# Patient Record
Sex: Male | Born: 1955 | Race: White | Hispanic: No | Marital: Married | Smoking: Never smoker
Health system: Southern US, Community
[De-identification: ages and names within clinical notes are randomized; demographics above are authoritative.]

## PROBLEM LIST (undated history)

## (undated) DIAGNOSIS — F419 Anxiety disorder, unspecified: Secondary | ICD-10-CM

## (undated) DIAGNOSIS — M109 Gout, unspecified: Secondary | ICD-10-CM

## (undated) DIAGNOSIS — E785 Hyperlipidemia, unspecified: Secondary | ICD-10-CM

## (undated) DIAGNOSIS — M1990 Unspecified osteoarthritis, unspecified site: Secondary | ICD-10-CM

## (undated) DIAGNOSIS — I10 Essential (primary) hypertension: Secondary | ICD-10-CM

## (undated) DIAGNOSIS — Z9109 Other allergy status, other than to drugs and biological substances: Secondary | ICD-10-CM

## (undated) DIAGNOSIS — K219 Gastro-esophageal reflux disease without esophagitis: Secondary | ICD-10-CM

## (undated) HISTORY — DX: Gout, unspecified: M10.9

## (undated) HISTORY — DX: Essential (primary) hypertension: I10

## (undated) HISTORY — PX: COLONOSCOPY: SHX174

## (undated) HISTORY — PX: HEEL SPUR SURGERY: SHX665

## (undated) HISTORY — DX: Hyperlipidemia, unspecified: E78.5

## (undated) HISTORY — DX: Anxiety disorder, unspecified: F41.9

---

## 1997-12-17 ENCOUNTER — Emergency Department (HOSPITAL_COMMUNITY): Admission: EM | Admit: 1997-12-17 | Discharge: 1997-12-17 | Payer: Self-pay | Admitting: Emergency Medicine

## 1998-01-04 ENCOUNTER — Ambulatory Visit (HOSPITAL_COMMUNITY): Admission: RE | Admit: 1998-01-04 | Discharge: 1998-01-04 | Payer: Self-pay | Admitting: Cardiology

## 1998-01-14 ENCOUNTER — Ambulatory Visit (HOSPITAL_COMMUNITY): Admission: RE | Admit: 1998-01-14 | Discharge: 1998-01-14 | Payer: Self-pay | Admitting: Cardiology

## 2000-04-29 ENCOUNTER — Emergency Department (HOSPITAL_COMMUNITY): Admission: EM | Admit: 2000-04-29 | Discharge: 2000-04-29 | Payer: Self-pay | Admitting: Emergency Medicine

## 2000-05-03 ENCOUNTER — Encounter: Admission: RE | Admit: 2000-05-03 | Discharge: 2000-05-03 | Payer: Self-pay

## 2005-07-19 ENCOUNTER — Encounter: Payer: Self-pay | Admitting: Cardiology

## 2007-11-27 ENCOUNTER — Encounter: Admission: RE | Admit: 2007-11-27 | Discharge: 2007-11-27 | Payer: Self-pay | Admitting: Cardiology

## 2010-12-08 ENCOUNTER — Other Ambulatory Visit: Payer: Self-pay | Admitting: Family Medicine

## 2010-12-08 DIAGNOSIS — E041 Nontoxic single thyroid nodule: Secondary | ICD-10-CM

## 2010-12-09 ENCOUNTER — Other Ambulatory Visit: Payer: Self-pay

## 2010-12-16 ENCOUNTER — Ambulatory Visit
Admission: RE | Admit: 2010-12-16 | Discharge: 2010-12-16 | Disposition: A | Discharge status: Home / Self Care | Payer: BC Managed Care – PPO | Source: Ambulatory Visit | Attending: Family Medicine | Admitting: Family Medicine

## 2010-12-16 DIAGNOSIS — E041 Nontoxic single thyroid nodule: Secondary | ICD-10-CM

## 2011-10-25 ENCOUNTER — Ambulatory Visit (INDEPENDENT_AMBULATORY_CARE_PROVIDER_SITE_OTHER): Payer: BC Managed Care – PPO | Admitting: Internal Medicine

## 2011-10-25 DIAGNOSIS — R0602 Shortness of breath: Secondary | ICD-10-CM

## 2011-10-25 LAB — PULMONARY FUNCTION TEST

## 2011-10-25 NOTE — Progress Notes (Signed)
PFT done today. 

## 2011-11-02 ENCOUNTER — Encounter: Payer: Self-pay | Admitting: Internal Medicine

## 2013-04-29 ENCOUNTER — Encounter: Payer: Self-pay | Admitting: Family Medicine

## 2013-04-29 DIAGNOSIS — M109 Gout, unspecified: Secondary | ICD-10-CM | POA: Insufficient documentation

## 2013-04-29 DIAGNOSIS — E785 Hyperlipidemia, unspecified: Secondary | ICD-10-CM | POA: Insufficient documentation

## 2013-04-29 DIAGNOSIS — I10 Essential (primary) hypertension: Secondary | ICD-10-CM | POA: Insufficient documentation

## 2013-10-17 ENCOUNTER — Telehealth: Payer: Self-pay | Admitting: Cardiology

## 2013-10-17 NOTE — Telephone Encounter (Signed)
Dicky DoeLinda Ballard is calling regarding the phone conversation from the other day.  [phone]

## 2013-10-17 NOTE — Telephone Encounter (Signed)
Message attached to wrong chart.

## 2013-11-23 ENCOUNTER — Encounter: Payer: Self-pay | Admitting: *Deleted

## 2019-09-17 ENCOUNTER — Encounter: Payer: Self-pay | Admitting: Family Medicine

## 2019-09-17 ENCOUNTER — Ambulatory Visit: Payer: Medicare PPO | Admitting: Family Medicine

## 2019-09-17 ENCOUNTER — Other Ambulatory Visit: Payer: Self-pay

## 2019-09-17 ENCOUNTER — Ambulatory Visit (INDEPENDENT_AMBULATORY_CARE_PROVIDER_SITE_OTHER): Payer: Medicare PPO

## 2019-09-17 VITALS — BP 105/71 | HR 71 | Temp 99.3°F

## 2019-09-17 DIAGNOSIS — I10 Essential (primary) hypertension: Secondary | ICD-10-CM

## 2019-09-17 DIAGNOSIS — R0789 Other chest pain: Secondary | ICD-10-CM

## 2019-09-17 DIAGNOSIS — Z77090 Contact with and (suspected) exposure to asbestos: Secondary | ICD-10-CM | POA: Diagnosis not present

## 2019-09-17 DIAGNOSIS — R062 Wheezing: Secondary | ICD-10-CM | POA: Diagnosis not present

## 2019-09-17 DIAGNOSIS — E119 Type 2 diabetes mellitus without complications: Secondary | ICD-10-CM | POA: Insufficient documentation

## 2019-09-17 MED ORDER — LORATADINE 10 MG PO TABS: 10.0000 mg | ORAL_TABLET | Freq: Every day | ORAL | 11 refills | Status: DC | PRN

## 2019-09-17 MED ORDER — ALBUTEROL SULFATE HFA 108 (90 BASE) MCG/ACT IN AERS
2.0000 | INHALATION_SPRAY | Freq: Four times a day (QID) | RESPIRATORY_TRACT | 6 refills | Status: DC | PRN
Start: 2019-09-17 — End: 2021-08-01

## 2019-09-17 MED ORDER — MONTELUKAST SODIUM 10 MG PO TABS: 10.0000 mg | ORAL_TABLET | Freq: Every day | ORAL | 11 refills | Status: DC

## 2019-09-17 NOTE — Progress Notes (Signed)
Office Visit Note   Patient: Theodore Ballard           Date of Birth: October 11, 1955           MRN: [id number] Visit Date: 09/17/2019 Requested by: No referring provider defined for this encounter. PCP: No primary care provider on file.  Subjective: Chief Complaint  Patient presents with  . SOB, weakness, slt ST, cough/wheezing    HPI: He is here to reestablish care.  His main reason for the visit is shortness of breath, wheezing and chest tightness.  Symptoms started about 2 or 3 weeks ago.  He thought he had a bad cold.  His symptoms were not improving so he went and got tested for Covid, it was negative.  He was given albuterol which has helped.  He continues to have shortness of breath and weakness.  Denies fevers or chills, his head congestion is almost resolved.  No gastrointestinal symptoms.  He has never had cardiac problems.  He does note that his previous PCP was getting EKGs on a regular basis for a while but eventually he felt that everything looked okay.  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  He has had lung cancer screening in the past and would like to have that done again.  He has hypertension and is now diabetic.  His diabetes had been well controlled with an A1c of around 6, but he has not been to his previous PCP in about 2 years.  He checks his blood sugar regularly at home.  He has a history of gout affecting multiple joints.  He has a lot of arthritis.               ROS:   All other systems were reviewed and are negative.  Objective: Vital Signs: BP 105/71   Pulse 71   Temp 99.3 F (37.4 C)   SpO2 96%   Physical Exam:  General:  Alert and oriented, in no acute distress. Pulm:  Breathing unlabored. Psy:  Normal mood, congruent affect.  HEENT:  [place]/AT, PERRLA, EOM Full, no nystagmus.  Funduscopic examination within normal limits.  No conjunctival erythema.  Tympanic membranes are pearly gray with normal  landmarks.  External ear canals are clear on the left, partially occluded by cerumen on the right.  Nasal passages are slightly congested.  Oropharynx is clear.  No significant lymphadenopathy.  No thyromegaly or nodules.  2+ carotid pulses without bruits. CV: Regular rate and rhythm without murmurs, rubs, or gallops.  No peripheral edema.  2+ radial and posterior tibial pulses. Lungs: Clear to auscultation throughout with no wheezing or areas of consolidation.    Imaging: XR Chest 2 View  Result Date: 09/17/2019 Chest x-ray shows no sign of pneumonia.  I do not see any nodules.  Heart size appears normal.   Assessment & Plan: 1.  Wheezing and chest tightness, possibly allergy mediated. -We will add Singulair and Claritin to his albuterol regimen. -We will draw cardiac labs and order EKG as well.  2.  History of asbestos exposure -CT scan to screen for mesothelioma.  3.  Diabetes and hypertension -Labs to monitor.     Procedures: No procedures performed  No notes on file     PMFS History: Patient Active Problem List   Diagnosis Date Noted  . Asbestos exposure 09/17/2019  . Diabetes (HCC) 09/17/2019  . Hypertension 04/29/2013  . Other and unspecified hyperlipidemia 04/29/2013  .  Gout 04/29/2013   Past Medical History:  Diagnosis Date  . Anxiety   . Gout   . Hyperlipidemia   . Hypertension     Family History  Problem Relation Age of Onset  . Cancer Mother   . Heart disease Father   . Hypertension Father   . Cancer Sister     History reviewed. No pertinent surgical history. Social History   Occupational History  . Not on file  Tobacco Use  . Smoking status: Never Smoker  . Smokeless tobacco: Never Used  Substance and Sexual Activity  . Alcohol use: No  . Drug use: No  . Sexual activity: Yes

## 2019-09-18 ENCOUNTER — Telehealth: Payer: Self-pay | Admitting: Family Medicine

## 2019-09-18 DIAGNOSIS — R7989 Other specified abnormal findings of blood chemistry: Secondary | ICD-10-CM

## 2019-09-18 DIAGNOSIS — E781 Pure hyperglyceridemia: Secondary | ICD-10-CM

## 2019-09-18 LAB — CBC WITH DIFFERENTIAL/PLATELET
Absolute Monocytes: 943 cells/uL (ref 200–950)
Basophils Absolute: 72 cells/uL (ref 0–200)
Basophils Relative: 1 %
Eosinophils Absolute: 130 cells/uL (ref 15–500)
Eosinophils Relative: 1.8 %
HCT: 43.7 % (ref 38.5–50.0)
Hemoglobin: 15.4 g/dL (ref 13.2–17.1)
Lymphs Abs: 1980 cells/uL (ref 850–3900)
MCH: 32.7 pg (ref 27.0–33.0)
MCHC: 35.2 g/dL (ref 32.0–36.0)
MCV: 92.8 fL (ref 80.0–100.0)
MPV: 11.2 fL (ref 7.5–12.5)
Monocytes Relative: 13.1 %
Neutro Abs: 4075 cells/uL (ref 1500–7800)
Neutrophils Relative %: 56.6 %
Platelets: 204 10*3/uL (ref 140–400)
RBC: 4.71 10*6/uL (ref 4.20–5.80)
RDW: 13.5 % (ref 11.0–15.0)
Total Lymphocyte: 27.5 %
WBC: 7.2 10*3/uL (ref 3.8–10.8)

## 2019-09-18 LAB — HEMOGLOBIN A1C
Hgb A1c MFr Bld: 6.2 % of total Hgb — ABNORMAL HIGH (ref <5.7)
Mean Plasma Glucose: 131 (calc)
eAG (mmol/L): 7.3 (calc)

## 2019-09-18 LAB — SEDIMENTATION RATE: Sed Rate: 22 mm/h — ABNORMAL HIGH (ref 0–20)

## 2019-09-18 LAB — COMPREHENSIVE METABOLIC PANEL
AG Ratio: 1.7 (calc) (ref 1.0–2.5)
ALT: 35 U/L (ref 9–46)
AST: 27 U/L (ref 10–35)
Albumin: 4.5 g/dL (ref 3.6–5.1)
Alkaline phosphatase (APISO): 77 U/L (ref 35–144)
BUN/Creatinine Ratio: 16 (calc) (ref 6–22)
BUN: 21 mg/dL (ref 7–25)
CO2: 27 mmol/L (ref 20–32)
Calcium: 10.1 mg/dL (ref 8.6–10.3)
Chloride: 103 mmol/L (ref 98–110)
Creat: 1.28 mg/dL — ABNORMAL HIGH (ref 0.70–1.25)
Globulin: 2.7 g/dL (calc) (ref 1.9–3.7)
Glucose, Bld: 80 mg/dL (ref 65–139)
Potassium: 4.4 mmol/L (ref 3.5–5.3)
Sodium: 141 mmol/L (ref 135–146)
Total Bilirubin: 0.6 mg/dL (ref 0.2–1.2)
Total Protein: 7.2 g/dL (ref 6.1–8.1)

## 2019-09-18 LAB — LIPID PANEL
Cholesterol: 180 mg/dL (ref <200)
HDL: 29 mg/dL — ABNORMAL LOW (ref >=40)
Non-HDL Cholesterol (Calc): 151 mg/dL (calc) — ABNORMAL HIGH (ref <130)
Total CHOL/HDL Ratio: 6.2 (calc) — ABNORMAL HIGH (ref <5.0)
Triglycerides: 693 mg/dL — ABNORMAL HIGH (ref <150)

## 2019-09-18 LAB — CK TOTAL AND CKMB (NOT AT ARMC)
CK, MB: 0.9 ng/mL (ref 0–5.0)
Relative Index: 1 (ref 0–4.0)
Total CK: 94 U/L (ref 44–196)

## 2019-09-18 LAB — C-REACTIVE PROTEIN: CRP: 8.9 mg/L — ABNORMAL HIGH (ref <8.0)

## 2019-09-18 LAB — TROPONIN I: Troponin I: 4 ng/L (ref <=47)

## 2019-09-18 NOTE — Telephone Encounter (Signed)
Labs are notable for the following:  Heart enzymes were normal.  C-reactive protein, inflammation marker, is slightly elevated at 8.9.  Diabetes looks okay with hemoglobin A1c of 6.2.  Kidney function/creatinine is borderline abnormal at 1.28.  We should recheck this.  Theodore Ballard is borderline 2 to 3 months.  Lipid panel is abnormal with a triglyceride level of 693.  This can potentially cause pancreatitis.  It is very important to eat healthfully and minimize intake of sugary foods and fatty foods.  We should recheck triglycerides in 1 to 2 weeks to be sure they are coming down.

## 2019-09-19 ENCOUNTER — Ambulatory Visit: Payer: Medicare PPO

## 2019-09-19 ENCOUNTER — Telehealth: Payer: Self-pay | Admitting: *Deleted

## 2019-09-19 NOTE — Telephone Encounter (Signed)
I called and advised the patient of his results and diet instructions. He will come in on 09/29/19 to have his fasting lipids rechecked.

## 2019-09-19 NOTE — Telephone Encounter (Signed)
Received call and email from [facility] with [facility] stating order needs to be changed to CT CHEST HIGH RESOLUTION IMG 1263 due to dx. Order has been changed and email has been sent to [facility] advising her of the changed.

## 2019-09-23 ENCOUNTER — Encounter: Payer: Self-pay | Admitting: Family Medicine

## 2019-09-23 ENCOUNTER — Ambulatory Visit: Payer: Medicare PPO | Admitting: Family Medicine

## 2019-09-23 ENCOUNTER — Other Ambulatory Visit: Payer: Self-pay

## 2019-09-23 DIAGNOSIS — R300 Dysuria: Secondary | ICD-10-CM

## 2019-09-23 MED ORDER — SULFAMETHOXAZOLE-TRIMETHOPRIM 800-160 MG PO TABS: 1.0000 | ORAL_TABLET | Freq: Two times a day (BID) | ORAL | 0 refills | Status: DC

## 2019-09-23 NOTE — Progress Notes (Signed)
   Office Visit Note   Patient: Theodore Ballard           Date of Birth: Jul 27, 1955           MRN: [id number] Visit Date: 09/23/2019 Requested by: Lavada Mesi, MD [address] [place],  [place] [address] PCP: Lavada Mesi, MD  Subjective: Chief Complaint  Patient presents with  . dysuria x 6 days, worse in morning    HPI: He is here with burning on urination.  Symptoms started about 6 days ago.  It is worse in the morning, better as the day progresses.  No definite fever or chills.              ROS:   All other systems were reviewed and are negative.  Objective: Vital Signs: There were no vitals taken for this visit.  Physical Exam:  General:  Alert and oriented, in no acute distress. Pulm:  Breathing unlabored. Psy:  Normal mood, congruent affect. Skin: No rash Back: No significant CVA tenderness.  Imaging: No results found.  Assessment & Plan: 1.  Dysuria, possible UTI -We will do urine studies, treat with Bactrim.  Will notify me if symptoms worsen.     Procedures: No procedures performed  No notes on file     PMFS History: Patient Active Problem List   Diagnosis Date Noted  . Asbestos exposure 09/17/2019  . Diabetes (HCC) 09/17/2019  . Hypertension 04/29/2013  . Other and unspecified hyperlipidemia 04/29/2013  . Gout 04/29/2013   Past Medical History:  Diagnosis Date  . Anxiety   . Gout   . Hyperlipidemia   . Hypertension     Family History  Problem Relation Age of Onset  . Cancer Mother   . Heart disease Father   . Hypertension Father   . Cancer Sister     History reviewed. No pertinent surgical history. Social History   Occupational History  . Not on file  Tobacco Use  . Smoking status: Never Smoker  . Smokeless tobacco: Never Used  Substance and Sexual Activity  . Alcohol use: No  . Drug use: No  . Sexual activity: Yes

## 2019-09-24 ENCOUNTER — Telehealth: Payer: Self-pay | Admitting: Family Medicine

## 2019-09-24 LAB — URINALYSIS W MICROSCOPIC + REFLEX CULTURE
Bacteria, UA: NONE SEEN /HPF
Bilirubin Urine: NEGATIVE
Glucose, UA: NEGATIVE
Hgb urine dipstick: NEGATIVE
Hyaline Cast: NONE SEEN /LPF
Ketones, ur: NEGATIVE
Leukocyte Esterase: NEGATIVE
Nitrites, Initial: NEGATIVE
Protein, ur: NEGATIVE
RBC / HPF: NONE SEEN /HPF (ref 0–2)
Specific Gravity, Urine: 1.019 (ref 1.001–1.03)
Squamous Epithelial / HPF: NONE SEEN /HPF (ref <=5)
WBC, UA: NONE SEEN /HPF (ref 0–5)
pH: <=5 (ref 5.0–8.0)

## 2019-09-24 LAB — NO CULTURE INDICATED

## 2019-09-24 MED ORDER — CIPROFLOXACIN HCL 500 MG PO TABS: 500.0000 mg | ORAL_TABLET | Freq: Two times a day (BID) | ORAL | 0 refills | Status: DC

## 2019-09-24 NOTE — Telephone Encounter (Signed)
Urine looks normal

## 2019-09-24 NOTE — Telephone Encounter (Signed)
Cipro Rx sent instead.

## 2019-09-24 NOTE — Telephone Encounter (Signed)
Patient called. Says he started taking the bactrim yesterday and now has bumps under his arm and is red. Would like Terri to call him. His number is [phone]

## 2019-09-24 NOTE — Telephone Encounter (Signed)
I called the patient. The rash is getting better, since he first called. He does have Benedryl to take, if needed. Added Bactrim to his allergy list. Advised of the new antibiotic sent in. He is to call us with any problems.

## 2019-09-24 NOTE — Telephone Encounter (Signed)
Change antibiotic?

## 2019-09-29 ENCOUNTER — Other Ambulatory Visit: Payer: Self-pay

## 2019-09-29 ENCOUNTER — Other Ambulatory Visit: Payer: Self-pay | Admitting: Family Medicine

## 2019-09-29 DIAGNOSIS — R300 Dysuria: Secondary | ICD-10-CM

## 2019-09-29 DIAGNOSIS — R7989 Other specified abnormal findings of blood chemistry: Secondary | ICD-10-CM

## 2019-09-29 DIAGNOSIS — E781 Pure hyperglyceridemia: Secondary | ICD-10-CM

## 2019-09-29 MED ORDER — GLIPIZIDE ER 2.5 MG PO TB24: 2.5000 mg | ORAL_TABLET | Freq: Two times a day (BID) | ORAL | 1 refills | Status: DC

## 2019-09-30 ENCOUNTER — Telehealth: Payer: Self-pay | Admitting: Family Medicine

## 2019-09-30 DIAGNOSIS — R972 Elevated prostate specific antigen [PSA]: Secondary | ICD-10-CM

## 2019-09-30 DIAGNOSIS — E781 Pure hyperglyceridemia: Secondary | ICD-10-CM

## 2019-09-30 LAB — BASIC METABOLIC PANEL
BUN: 22 mg/dL (ref 7–25)
CO2: 25 mmol/L (ref 20–32)
Calcium: 9.5 mg/dL (ref 8.6–10.3)
Chloride: 99 mmol/L (ref 98–110)
Creat: 0.87 mg/dL (ref 0.70–1.25)
Glucose, Bld: 124 mg/dL — ABNORMAL HIGH (ref 65–99)
Potassium: 5.2 mmol/L (ref 3.5–5.3)
Sodium: 136 mmol/L (ref 135–146)

## 2019-09-30 LAB — TRIGLYCERIDES: Triglycerides: 769 mg/dL — ABNORMAL HIGH (ref <150)

## 2019-09-30 LAB — PSA: PSA: 4.6 ng/mL — ABNORMAL HIGH (ref <=4.0)

## 2019-09-30 MED ORDER — FENOFIBRATE 145 MG PO TABS: 145.0000 mg | ORAL_TABLET | Freq: Every day | ORAL | 3 refills | Status: DC

## 2019-09-30 NOTE — Telephone Encounter (Signed)
Labs show:  Triglycerides are even higher at 769.  This puts you at higher risk for pancreatitis.  I will call in fenofibrate to bring the levels down.  Recheck in about 4-6 weeks.  Prostate PSA is elevated at 4.6.  This could be due to prostatitis (inflammation or infection of the prostate).  But after we finish treatment, we should recheck PSA in about 6-12 weeks to be sure it's normalizing.  Kidney function (creatinine) is back in normal range.

## 2019-09-30 NOTE — Telephone Encounter (Signed)
Patient read his results in MyChart. Scheduled recheck of PSA & triglycerides for 11/14/19.

## 2019-10-01 ENCOUNTER — Ambulatory Visit
Admission: RE | Admit: 2019-10-01 | Discharge: 2019-10-01 | Disposition: A | Discharge status: Home / Self Care | Payer: Medicare PPO | Source: Ambulatory Visit | Attending: Family Medicine | Admitting: Family Medicine

## 2019-10-01 DIAGNOSIS — Z77090 Contact with and (suspected) exposure to asbestos: Secondary | ICD-10-CM

## 2019-10-02 ENCOUNTER — Telehealth: Payer: Self-pay | Admitting: Family Medicine

## 2019-10-02 DIAGNOSIS — R0789 Other chest pain: Secondary | ICD-10-CM

## 2019-10-02 DIAGNOSIS — I7090 Unspecified atherosclerosis: Secondary | ICD-10-CM

## 2019-10-02 NOTE — Telephone Encounter (Signed)
CT scan shows no sign of cancer, but there is calcification (and therefore plaque build-up) in the heart arteries and also in the aortic valve of the heart.   I recommend referral to a cardiologist to do additional testing.

## 2019-10-02 NOTE — Addendum Note (Signed)
Addended by: Lillia Carmel on: 10/02/2019 09:02 AM   Modules accepted: Orders

## 2019-11-14 ENCOUNTER — Ambulatory Visit (INDEPENDENT_AMBULATORY_CARE_PROVIDER_SITE_OTHER): Payer: Medicare PPO

## 2019-11-14 ENCOUNTER — Other Ambulatory Visit: Payer: Self-pay

## 2019-11-14 DIAGNOSIS — R972 Elevated prostate specific antigen [PSA]: Secondary | ICD-10-CM

## 2019-11-14 DIAGNOSIS — E781 Pure hyperglyceridemia: Secondary | ICD-10-CM | POA: Diagnosis not present

## 2019-11-14 NOTE — Progress Notes (Signed)
CMET, CBC/diff and triglycerides drawn (fasting) per Dr. Prince Rome.

## 2019-11-16 NOTE — Progress Notes (Signed)
Cardiology Office Note:    Date:  11/21/2019   ID:  Theodore, Ballard March 27, 1955, MRN [id number]  PCP:  Theodore Mesi, MD  Cardiologist:  No primary care provider on file.   Referring MD: Theodore Mesi, MD   Chief Complaint  Patient presents with  . Coronary Artery Disease    History of Present Illness:    Theodore Ballard is a 64 y.o. male with a hx of DM II, Hypertension, Hyperlipidemia, and coronary calcification on non coronary high resolution chest CT. Also h/o asbestos exposure.  Mrs. Theodore Ballard reminds me that I saw him 15 years ago for palpitations.  We did not identify a particular rhythm disturbance at that time.  He also tells me that I was suspicious he had sleep apnea but this was not corroborated by testing.  He still has loud snoring to the point that he and his wife no longer sleep in the same bed.  States he is sluggish many mornings when he awakens because he feels he has not slept well.  Because of prior asbestos exposure, a high resolution chest CT was performed by Dr. Rennis Ballard to exclude the possibility of cancer.  An ancillary finding was the presence of three-vessel coronary calcification.  This is led to the cardiac evaluation for asymptomatic coronary atherosclerosis.  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  He walks great distances without shortness of breath or discomfort in the chest.  He is here for preventive measures if possible.  He has significant hypertriglyceridemia that in the past has been treated with fibrate's but these were stopped because of lower extremity discomfort.  Risk factors for coronary disease include type 2 diabetes, essential hypertension, difficulty with sleep, and snoring.  His father died at 33.  Lifelong history of hypertension.  His grandfather died of CVA.  He has siblings without history of vascular disease.  Past Medical History:  Diagnosis Date  . Anxiety   . Gout   .  Hyperlipidemia   . Hypertension     History reviewed. No pertinent surgical history.  Current Medications: Current Meds  Medication Sig  . albuterol (VENTOLIN HFA) 108 (90 Base) MCG/ACT inhaler Inhale 2 puffs into the lungs every 6 (six) hours as needed for wheezing or shortness of breath.  . allopurinol (ZYLOPRIM) 300 MG tablet   . aspirin 81 MG tablet Take 81 mg by mouth daily.  Marland Kitchen atenolol (TENORMIN) 50 MG tablet Take 50 mg by mouth 2 (two) times daily.   Marland Kitchen glipiZIDE (GLUCOTROL XL) 2.5 MG 24 hr tablet Take 1 tablet (2.5 mg total) by mouth in the morning and at bedtime.  Marland Kitchen lisinopril (PRINIVIL,ZESTRIL) 10 MG tablet Take 10 mg by mouth daily.  . pantoprazole (PROTONIX) 40 MG tablet   . pravastatin (PRAVACHOL) 40 MG tablet Take 1.5 tablets (60 mg total) by mouth daily.     Allergies:   Bactrim [sulfamethoxazole-trimethoprim] and Penicillins   Social History   Socioeconomic History  . Marital status: Married    Spouse name: Not on file  . Number of children: Not on file  . Years of education: Not on file  . Highest education level: Not on file  Occupational History  . Not on file  Tobacco Use  . Smoking status: Never Smoker  . Smokeless tobacco: Never Used  Substance and Sexual Activity  . Alcohol use: No  . Drug use: No  .  Sexual activity: Yes  Other Topics Concern  . Not on file  Social History Narrative  . Not on file   Social Determinants of Health   Financial Resource Strain:   . Difficulty of Paying Living Expenses: Not on file  Food Insecurity:   . Worried About Programme researcher, broadcasting/film/video in the Last Year: Not on file  . Ran Out of Food in the Last Year: Not on file  Transportation Needs:   . Lack of Transportation (Medical): Not on file  . Lack of Transportation (Non-Medical): Not on file  Physical Activity:   . Days of Exercise per Week: Not on file  . Minutes of Exercise per Session: Not on file  Stress:   . Feeling of Stress : Not on file  Social  Connections:   . Frequency of Communication with Friends and Family: Not on file  . Frequency of Social Gatherings with Friends and Family: Not on file  . Attends Religious Services: Not on file  . Active Member of Clubs or Organizations: Not on file  . Attends Banker Meetings: Not on file  . Marital Status: Not on file     Family History: The patient's family history includes Cancer in his mother and sister; Heart disease in his father; Hypertension in his father.  ROS:   Please see the history of present illness.    No new data.  He is not short of breath.  He does not cough.  Palpitations are no longer an issue.  He has tried fish oil in the past and had gout because of this.  All other systems reviewed and are negative.  EKGs/Labs/Other Studies Reviewed:    The following studies were reviewed today: No new imaging.  High resolution CT scan July 2021:  IMPRESSION: 1. No evidence to suggest asbestos related pleural disease or asbestosis. 2. 3 mm right lower lobe pulmonary nodule, nonspecific, but statistically likely benign. No follow-up needed if patient is low-risk. Non-contrast chest CT can be considered in 12 months if patient is high-risk. This recommendation follows the consensus statement: Guidelines for Management of Incidental Pulmonary Nodules Detected on CT Images: From the Fleischner Society 2017; Radiology 2017; 284:228-243. 3. Aortic atherosclerosis, in addition to left main and 2 vessel coronary artery disease. Please note that although the presence of coronary artery calcium documents the presence of coronary artery disease, the severity of this disease and any potential stenosis cannot be assessed on this non-gated CT examination. Assessment for potential risk factor modification, dietary therapy or pharmacologic therapy may be warranted, if clinically indicated. 4. There are calcifications of the aortic valve. Echocardiographic correlation  for evaluation of potential valvular dysfunction may be warranted if clinically indicated.  Aortic Atherosclerosis (ICD10-I70.0).   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  When compared to the prior tracing from July 2015  Recent Labs: 09/17/2019: Hemoglobin 15.4; Platelets 204 11/14/2019: ALT 15; BUN 17; Creat 0.83; Potassium 4.3; Sodium 138  Recent Lipid Panel    Component Value Date/Time   CHOL 180 09/17/2019 1304   TRIG 762 (H) 11/14/2019 0915   HDL 29 (L) 09/17/2019 1304   CHOLHDL 6.2 (H) 09/17/2019 1304   LDLCALC  09/17/2019 1304     Comment:     . LDL cholesterol not calculated. Triglyceride levels greater than 400 mg/dL invalidate calculated LDL results. . Reference range: <100 . Desirable range <100 mg/dL for primary prevention;   <70 mg/dL  for patients with CHD or diabetic patients  with > or = 2 CHD risk factors. Marland Kitchen LDL-C is now calculated using the Martin-Hopkins  calculation, which is a validated novel method providing  better accuracy than the Friedewald equation in the  estimation of LDL-C.  Theodore Ballard et al. Lenox Ahr. 0814;481(85): 2061-2068  (http://education.[employer].com/faq/FAQ164)     Physical Exam:    VS:  BP (!) 134/92   Pulse (!) 50   Ht 5' 11.5" (1.816 m)   Wt 274 lb 12.8 oz (124.6 kg)   SpO2 98%   BMI 37.79 kg/m     Wt Readings from Last 3 Encounters:  11/21/19 274 lb 12.8 oz (124.6 kg)     GEN: Obese. No acute distress HEENT: Normal NECK: No JVD. LYMPHATICS: No lymphadenopathy CARDIAC:  RRR without murmur, gallop, or edema. VASCULAR:  Normal Pulses. No bruits. RESPIRATORY:  Clear to auscultation without rales, wheezing or rhonchi  ABDOMEN: Soft, non-tender, non-distended, No pulsatile mass, MUSCULOSKELETAL: No deformity  SKIN: Warm and dry NEUROLOGIC:  Alert and oriented x 3 PSYCHIATRIC:  Normal affect   ASSESSMENT:    1. Coronary artery  calcification seen on CAT scan   2. Type 2 diabetes mellitus without complication, without long-term current use of insulin (HCC)   3. Essential hypertension   4. Hyperlipidemia LDL goal <70   5. Hypertriglyceridemia   6. Morbid obesity (HCC)   7. Educated about COVID-19 virus infection   8. Asbestos exposure    PLAN:    In order of problems listed above:  Overall education and awareness concerning primary risk prevention was discussed in detail: LDL less than 70, hemoglobin A1c less than 7, blood pressure target less than 130/80 mmHg, >150 minutes of moderate aerobic activity per week, avoidance of smoking, weight control (via diet and exercise), and continued surveillance/management of/for obstructive sleep apnea. The most recent hemoglobin A1c was 6.2.  Current therapy is Glucotrol and diet.  Consider SGLT2 at some point in the future if necessary.   Blood pressure today is borderline.  Target is 130/80.  Diastolic today is 92.  Low-salt diet, weight loss, and avoidance of nonsteroidals is suggested to the patient.  Continue Zestril 10 mg/day.  This could be increased to 20.  Continue Tenormin 50 mg/day.  Could consider adding low-dose diuretic although he is troubled by gout. LDL cholesterol when checked earlier this year could not be assessed because of hypertriglyceridemia.  The non-HDL cholesterol was 151.  This is 50 points higher than it should be and we should therefore consider increasing intensity of statin therapy.  Continue pravastatin at 60 mg/day.  Consider switching to rosuvastatin. He has tried fibrate's in the past.  These medications cause leg aching and pain.  We will try Vascepa 1000 mg twice daily for 1 month and then increase to 2 g twice daily.  Hopefully this will not precipitate gout.  Protective benefit from the cardiac standpoint can be seen in the absence of significant reduction and triglyceride levels. Needs to do something about this problem.  Caloric intake should  decrease, carbohydrate intake should decrease, sugar containing soft drinks should also be avoided. He has been vaccinated and is practicing medication techniques. No comment   Medication Adjustments/Labs and Tests Ordered: Current medicines are reviewed at length with the patient today.  Concerns regarding medicines are outlined above.  Orders Placed This Encounter  Procedures  . Lipid panel  . EKG 12-Lead   Meds ordered this encounter  Medications  . icosapent  Ethyl (VASCEPA) 1 g capsule    Sig: Take 2 capsules (2 g total) by mouth 2 (two) times daily.    Dispense:  120 capsule    Refill:  11    Patient Instructions  Medication Instructions:  1) START Vascepa 1 gram twice daily for a week and then increase to 2 grams twice daily.  *If you need a refill on your cardiac medications before your next appointment, please call your pharmacy*   Lab Work: Lipid panel in 2 months.  You will need to be fasting for this lab (nothing to eat or drink after midnight except water and black coffee).  If you have labs (blood work) drawn today and your tests are completely normal, you will receive your results only by: Marland Kitchen. MyChart Message (if you have MyChart) OR . A paper copy in the mail If you have any lab test that is abnormal or we need to change your treatment, we will call you to review the results.   Testing/Procedures: None   Follow-Up: At [facility], you and your health needs are our priority.  As part of our continuing mission to provide you with exceptional heart care, we have created designated Provider Care Teams.  These Care Teams include your primary Cardiologist (physician) and Advanced Practice Providers (APPs -  Physician Assistants and Nurse Practitioners) who all work together to provide you with the care you need, when you need it.  We recommend signing up for the patient portal called "MyChart".  Sign up information is provided on this After Visit Summary.  MyChart  is used to connect with patients for Virtual Visits (Telemedicine).  Patients are able to view lab/test results, encounter notes, upcoming appointments, etc.  Non-urgent messages can be sent to your provider as well.   To learn more about what you can do with MyChart, go to [email or web address].    Your next appointment:   12 month(s)  The format for your next appointment:   In Person  Provider:   You may see Dr. Verdis PrimeHenry Atom Solivan or one of the following Advanced Practice Providers on your designated Care Team:    Norma FredricksonLori Gerhardt, NP  Nada BoozerLaura Ingold, NP  Georgie ChardJill McDaniel, NP    Other Instructions      Signed, Lesleigh NoeHenry W Jenelle Drennon III, MD  11/21/2019 4:18 PM    [facility]

## 2019-11-17 ENCOUNTER — Telehealth: Payer: Self-pay | Admitting: Family Medicine

## 2019-11-17 LAB — PSA, TOTAL AND FREE
PSA, % Free: 35 % (calc) (ref >25)
PSA, Free: 0.9 ng/mL
PSA, Total: 2.6 ng/mL (ref <=4.0)

## 2019-11-17 LAB — COMPREHENSIVE METABOLIC PANEL
AG Ratio: 1.7 (calc) (ref 1.0–2.5)
ALT: 15 U/L (ref 9–46)
AST: 17 U/L (ref 10–35)
Albumin: 4.1 g/dL (ref 3.6–5.1)
Alkaline phosphatase (APISO): 57 U/L (ref 35–144)
BUN: 17 mg/dL (ref 7–25)
CO2: 27 mmol/L (ref 20–32)
Calcium: 8.9 mg/dL (ref 8.6–10.3)
Chloride: 102 mmol/L (ref 98–110)
Creat: 0.83 mg/dL (ref 0.70–1.25)
Globulin: 2.4 g/dL (calc) (ref 1.9–3.7)
Glucose, Bld: 119 mg/dL — ABNORMAL HIGH (ref 65–99)
Potassium: 4.3 mmol/L (ref 3.5–5.3)
Sodium: 138 mmol/L (ref 135–146)
Total Bilirubin: 0.7 mg/dL (ref 0.2–1.2)
Total Protein: 6.5 g/dL (ref 6.1–8.1)

## 2019-11-17 LAB — TRIGLYCERIDES: Triglycerides: 762 mg/dL — ABNORMAL HIGH (ref <150)

## 2019-11-17 MED ORDER — PRAVASTATIN SODIUM 40 MG PO TABS: 60.0000 mg | ORAL_TABLET | Freq: Every day | ORAL | 6 refills | Status: DC

## 2019-11-17 NOTE — Addendum Note (Signed)
Addended by: Lillia Carmel on: 11/17/2019 05:33 PM   Modules accepted: Orders

## 2019-11-17 NOTE — Telephone Encounter (Signed)
Labs show:  PSA looks much better at 2.6.  Would suggest rechecking in 3 to 4 months to be sure it is stable.  Glucose is abnormal but better at 119.  Triglycerides are still elevated at 762.  Are you still on fenofibrate as well as pravastatin?

## 2019-11-21 ENCOUNTER — Ambulatory Visit: Payer: Medicare PPO | Admitting: Interventional Cardiology

## 2019-11-21 ENCOUNTER — Encounter: Payer: Self-pay | Admitting: Interventional Cardiology

## 2019-11-21 ENCOUNTER — Other Ambulatory Visit: Payer: Self-pay

## 2019-11-21 VITALS — BP 134/92 | HR 50 | Ht 71.5 in | Wt 274.8 lb

## 2019-11-21 DIAGNOSIS — Z7189 Other specified counseling: Secondary | ICD-10-CM

## 2019-11-21 DIAGNOSIS — I2510 Atherosclerotic heart disease of native coronary artery without angina pectoris: Secondary | ICD-10-CM

## 2019-11-21 DIAGNOSIS — Z77090 Contact with and (suspected) exposure to asbestos: Secondary | ICD-10-CM

## 2019-11-21 DIAGNOSIS — E781 Pure hyperglyceridemia: Secondary | ICD-10-CM | POA: Diagnosis not present

## 2019-11-21 DIAGNOSIS — E785 Hyperlipidemia, unspecified: Secondary | ICD-10-CM | POA: Diagnosis not present

## 2019-11-21 DIAGNOSIS — I10 Essential (primary) hypertension: Secondary | ICD-10-CM

## 2019-11-21 DIAGNOSIS — E119 Type 2 diabetes mellitus without complications: Secondary | ICD-10-CM

## 2019-11-21 MED ORDER — ICOSAPENT ETHYL 1 G PO CAPS: 2.0000 g | ORAL_CAPSULE | Freq: Two times a day (BID) | ORAL | 11 refills | Status: DC

## 2019-11-21 NOTE — Patient Instructions (Signed)
Medication Instructions:  1) START Vascepa 1 gram twice daily for a week and then increase to 2 grams twice daily.  *If you need a refill on your cardiac medications before your next appointment, please call your pharmacy*   Lab Work: Lipid panel in 2 months.  You will need to be fasting for this lab (nothing to eat or drink after midnight except water and black coffee).  If you have labs (blood work) drawn today and your tests are completely normal, you will receive your results only by: Marland Kitchen MyChart Message (if you have MyChart) OR . A paper copy in the mail If you have any lab test that is abnormal or we need to change your treatment, we will call you to review the results.   Testing/Procedures: None   Follow-Up: At [facility], you and your health needs are our priority.  As part of our continuing mission to provide you with exceptional heart care, we have created designated Provider Care Teams.  These Care Teams include your primary Cardiologist (physician) and Advanced Practice Providers (APPs -  Physician Assistants and Nurse Practitioners) who all work together to provide you with the care you need, when you need it.  We recommend signing up for the patient portal called "MyChart".  Sign up information is provided on this After Visit Summary.  MyChart is used to connect with patients for Virtual Visits (Telemedicine).  Patients are able to view lab/test results, encounter notes, upcoming appointments, etc.  Non-urgent messages can be sent to your provider as well.   To learn more about what you can do with MyChart, go to [email or web address].    Your next appointment:   12 month(s)  The format for your next appointment:   In Person  Provider:   You may see Dr. Verdis Prime or one of the following Advanced Practice Providers on your designated Care Team:    Norma Fredrickson, NP  Nada Boozer, NP  Georgie Chard, NP    Other Instructions

## 2019-12-12 ENCOUNTER — Telehealth: Payer: Self-pay | Admitting: Family Medicine

## 2019-12-12 MED ORDER — OLOPATADINE HCL 0.1 % OP SOLN: 1.0000 [drp] | Freq: Two times a day (BID) | OPHTHALMIC | 12 refills | Status: AC

## 2019-12-12 NOTE — Telephone Encounter (Signed)
Rx sent.  Would recheck labs in 2-3 months.

## 2019-12-12 NOTE — Telephone Encounter (Signed)
Patient's wife called.   Another doctor of his gave him some medication that has caused his gout to worsen. The wife is requesting a call back to discuss what their next course of action should be.   Call back: [phone]

## 2019-12-12 NOTE — Addendum Note (Signed)
Addended by: Lillia Carmel on: 12/12/2019 01:51 PM   Modules accepted: Orders

## 2019-12-12 NOTE — Telephone Encounter (Signed)
I called and advised Theodore Ballard. Appointment made for repeat blood work 03/05/20 at 8:30.

## 2019-12-12 NOTE — Telephone Encounter (Signed)
I called the patient: Dr. Katrinka Blazing had put the patient on Vascepa, but the omega-3s in it caused his gout to flare-up. He stopped it per Dr. Michaelle Copas nurse. Theodore Ballard (the wife) wanted [place] to be aware of this. 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

## 2019-12-26 ENCOUNTER — Telehealth: Payer: Self-pay

## 2019-12-26 MED ORDER — DIPHENOXYLATE-ATROPINE 2.5-0.025 MG PO TABS: 1.0000 | ORAL_TABLET | Freq: Four times a day (QID) | ORAL | 0 refills | Status: AC | PRN

## 2019-12-26 NOTE — Telephone Encounter (Signed)
patient has had diarrhea since Tuesday , has tried imodium , dont know what to do , see if dr hilts  Can call him in anything that will help , or does patient need to come in at the earliest convenience.Marland KitchenMarland Kitchen

## 2019-12-26 NOTE — Telephone Encounter (Signed)
Please advise 

## 2019-12-26 NOTE — Telephone Encounter (Signed)
Lomotil Rx sent.  Would probably be a good idea to go to pharmacy and get tested for covid.

## 2019-12-26 NOTE — Telephone Encounter (Signed)
I left this information on Glenna's voice mail. Advised that he needs to try and replenish the fluids. Call back with any concerns/questions.

## 2019-12-26 NOTE — Telephone Encounter (Signed)
See other message on this from today ----- medication was sent in.

## 2019-12-26 NOTE — Telephone Encounter (Signed)
Per last message    Patient  Wife asked  to see if he can be worked in for today .Marland Kitchen

## 2020-01-23 ENCOUNTER — Other Ambulatory Visit: Payer: Medicare PPO

## 2020-02-09 ENCOUNTER — Telehealth: Payer: Self-pay | Admitting: Radiology

## 2020-02-09 MED ORDER — PANTOPRAZOLE SODIUM 40 MG PO TBEC: 40.0000 mg | DELAYED_RELEASE_TABLET | Freq: Every day | ORAL | 1 refills | Status: DC | PRN

## 2020-02-09 NOTE — Telephone Encounter (Signed)
I called and advised Theodore Ballard the Rx was sent in today.

## 2020-02-09 NOTE — Telephone Encounter (Signed)
I do not see that the pharmacy ever sent a request for pantoprazole. Please advise.

## 2020-02-09 NOTE — Telephone Encounter (Signed)
Rx sent.  I had not received a request until now.

## 2020-02-09 NOTE — Telephone Encounter (Signed)
Patient's wife called stating she received a text from the pharmacy that refill on pantoprazole was denied. She would like a call to discuss why medication refill was denied.  Please call [phone]. [facility] [place]

## 2020-02-27 ENCOUNTER — Telehealth: Payer: Self-pay | Admitting: Family Medicine

## 2020-02-27 NOTE — Telephone Encounter (Signed)
Pt called stating he would like to get his booster shot Tomasa Blase) when he comes for his appt on 03/05/20 and just wanted to make Dr. Prince Rome aware of this

## 2020-02-27 NOTE — Telephone Encounter (Signed)
I called the patient and advised him that we do not have these here in our office.

## 2020-03-05 ENCOUNTER — Other Ambulatory Visit: Payer: Self-pay

## 2020-03-05 ENCOUNTER — Ambulatory Visit (INDEPENDENT_AMBULATORY_CARE_PROVIDER_SITE_OTHER): Payer: Medicare PPO

## 2020-03-05 DIAGNOSIS — R7989 Other specified abnormal findings of blood chemistry: Secondary | ICD-10-CM | POA: Diagnosis not present

## 2020-03-05 DIAGNOSIS — I10 Essential (primary) hypertension: Secondary | ICD-10-CM | POA: Diagnosis not present

## 2020-03-05 DIAGNOSIS — E119 Type 2 diabetes mellitus without complications: Secondary | ICD-10-CM

## 2020-03-05 DIAGNOSIS — R972 Elevated prostate specific antigen [PSA]: Secondary | ICD-10-CM | POA: Diagnosis not present

## 2020-03-05 DIAGNOSIS — I7090 Unspecified atherosclerosis: Secondary | ICD-10-CM

## 2020-03-05 DIAGNOSIS — E781 Pure hyperglyceridemia: Secondary | ICD-10-CM | POA: Diagnosis not present

## 2020-03-05 NOTE — Progress Notes (Signed)
Lab visit only (fasting, drawn at 8:20): lipid panel, PSA, CMET, CRP & Hgb A1C.

## 2020-03-06 LAB — LIPID PANEL
Cholesterol: 161 mg/dL (ref <200)
HDL: 38 mg/dL — ABNORMAL LOW (ref >=40)
Non-HDL Cholesterol (Calc): 123 mg/dL (calc) (ref <130)
Total CHOL/HDL Ratio: 4.2 (calc) (ref <5.0)
Triglycerides: 771 mg/dL — ABNORMAL HIGH (ref <150)

## 2020-03-06 LAB — HEMOGLOBIN A1C
Hgb A1c MFr Bld: 5.8 % of total Hgb — ABNORMAL HIGH (ref <5.7)
Mean Plasma Glucose: 120 mg/dL
eAG (mmol/L): 6.6 mmol/L

## 2020-03-06 LAB — COMPREHENSIVE METABOLIC PANEL
AG Ratio: 1.5 (calc) (ref 1.0–2.5)
ALT: 17 U/L (ref 9–46)
AST: 19 U/L (ref 10–35)
Albumin: 4 g/dL (ref 3.6–5.1)
Alkaline phosphatase (APISO): 59 U/L (ref 35–144)
BUN: 14 mg/dL (ref 7–25)
CO2: 28 mmol/L (ref 20–32)
Calcium: 9.3 mg/dL (ref 8.6–10.3)
Chloride: 100 mmol/L (ref 98–110)
Creat: 0.81 mg/dL (ref 0.70–1.25)
Globulin: 2.6 g/dL (calc) (ref 1.9–3.7)
Glucose, Bld: 129 mg/dL — ABNORMAL HIGH (ref 65–99)
Potassium: 4.8 mmol/L (ref 3.5–5.3)
Sodium: 138 mmol/L (ref 135–146)
Total Bilirubin: 0.5 mg/dL (ref 0.2–1.2)
Total Protein: 6.6 g/dL (ref 6.1–8.1)

## 2020-03-06 LAB — C-REACTIVE PROTEIN: CRP: 1.5 mg/L (ref <8.0)

## 2020-03-06 LAB — PSA: PSA: 3.2 ng/mL (ref <=4.0)

## 2020-03-08 ENCOUNTER — Telehealth: Payer: Self-pay | Admitting: Family Medicine

## 2020-03-08 MED ORDER — FINASTERIDE 5 MG PO TABS: 5.0000 mg | ORAL_TABLET | Freq: Every day | ORAL | 3 refills | Status: DC

## 2020-03-08 NOTE — Telephone Encounter (Signed)
Labs are notable for the following:  C-reactive protein is now normal.  Prostate PSA has improved to normal range at 3.2.  We should recheck in 3 to 6 months to be sure it is stable.  Triglycerides still have not improved.  What is your current lipid regimen?  And are you still having side effects from cholesterol meds?  A1c has improved to 5.8.  Good work!

## 2020-03-08 NOTE — Addendum Note (Signed)
Addended by: Lillia Carmel on: 03/08/2020 11:55 AM   Modules accepted: Orders

## 2020-03-15 ENCOUNTER — Other Ambulatory Visit: Payer: Self-pay | Admitting: Family Medicine

## 2020-03-31 ENCOUNTER — Telehealth: Payer: Self-pay

## 2020-03-31 MED ORDER — ATENOLOL 50 MG PO TABS: 50.0000 mg | ORAL_TABLET | Freq: Two times a day (BID) | ORAL | 3 refills | Status: DC

## 2020-03-31 NOTE — Addendum Note (Signed)
Addended by: Lillia Carmel on: 03/31/2020 10:00 AM   Modules accepted: Orders

## 2020-03-31 NOTE — Telephone Encounter (Signed)
Done

## 2020-03-31 NOTE — Telephone Encounter (Signed)
Requests refill on [facility] to [facility].

## 2020-04-27 ENCOUNTER — Telehealth: Payer: Self-pay | Admitting: Family Medicine

## 2020-04-27 MED ORDER — LISINOPRIL 10 MG PO TABS: 10.0000 mg | ORAL_TABLET | Freq: Every day | ORAL | 3 refills | Status: DC

## 2020-04-27 NOTE — Telephone Encounter (Signed)
Rx sent 

## 2020-04-27 NOTE — Telephone Encounter (Signed)
Please advise 

## 2020-04-27 NOTE — Addendum Note (Signed)
Addended by: Lillia Carmel on: 04/27/2020 09:20 AM   Modules accepted: Orders

## 2020-04-27 NOTE — Telephone Encounter (Signed)
Pt wife called and said they tried to refill pt lisinopril and [facility]. He has a week left

## 2020-06-12 ENCOUNTER — Other Ambulatory Visit: Payer: Self-pay | Admitting: Family Medicine

## 2020-06-25 ENCOUNTER — Other Ambulatory Visit: Payer: Self-pay | Admitting: Family Medicine

## 2020-06-25 ENCOUNTER — Ambulatory Visit: Payer: Medicare PPO | Admitting: Family Medicine

## 2020-06-25 ENCOUNTER — Ambulatory Visit (INDEPENDENT_AMBULATORY_CARE_PROVIDER_SITE_OTHER): Payer: Medicare PPO

## 2020-06-25 ENCOUNTER — Other Ambulatory Visit: Payer: Self-pay

## 2020-06-25 DIAGNOSIS — E781 Pure hyperglyceridemia: Secondary | ICD-10-CM | POA: Diagnosis not present

## 2020-06-25 DIAGNOSIS — R972 Elevated prostate specific antigen [PSA]: Secondary | ICD-10-CM

## 2020-06-25 NOTE — Progress Notes (Signed)
Repeat PSA & lipid panel drawn per Dr. Prince Rome (fasting).

## 2020-06-26 LAB — LIPID PANEL
Cholesterol: 179 mg/dL (ref <200)
HDL: 39 mg/dL — ABNORMAL LOW (ref >=40)
Non-HDL Cholesterol (Calc): 140 mg/dL (calc) — ABNORMAL HIGH (ref <130)
Total CHOL/HDL Ratio: 4.6 (calc) (ref <5.0)
Triglycerides: 890 mg/dL — ABNORMAL HIGH (ref <150)

## 2020-06-26 LAB — PSA: PSA: 2.84 ng/mL (ref <=4.0)

## 2020-06-27 ENCOUNTER — Telehealth: Payer: Self-pay | Admitting: Family Medicine

## 2020-06-27 MED ORDER — GEMFIBROZIL 600 MG PO TABS: 600.0000 mg | ORAL_TABLET | Freq: Two times a day (BID) | ORAL | 3 refills | Status: DC

## 2020-06-27 NOTE — Telephone Encounter (Signed)
Triglycerides still very high.  Risk of developing pancreatitis at that level.  Let's stop pravachol and I'll call in Lopid to bring it down.  Recheck in 6-8 weeks.  PSA better at 2.84.

## 2020-08-02 ENCOUNTER — Telehealth: Payer: Self-pay

## 2020-08-02 MED ORDER — LISINOPRIL 20 MG PO TABS: 20.0000 mg | ORAL_TABLET | Freq: Every day | ORAL | 0 refills | Status: DC

## 2020-08-02 NOTE — Telephone Encounter (Signed)
Rx sent 

## 2020-08-02 NOTE — Telephone Encounter (Signed)
States he took Lisinopril twice a day by mistake; now they are needing refill The pharmacist said for insurance to cover to call in #10 tabs 20mg  and they can cut in half And go back to regular 10mg  after those tabs are gone

## 2020-08-02 NOTE — Telephone Encounter (Signed)
Patient aware Rx called in  

## 2020-08-02 NOTE — Telephone Encounter (Signed)
Patients wife called she is requesting a call back regarding medication the patient is taking call back:[phone]

## 2020-08-02 NOTE — Addendum Note (Signed)
Addended by: Lillia Carmel on: 08/02/2020 09:16 AM   Modules accepted: Orders

## 2020-08-06 DIAGNOSIS — L570 Actinic keratosis: Secondary | ICD-10-CM | POA: Diagnosis not present

## 2020-08-06 DIAGNOSIS — S20461A Insect bite (nonvenomous) of right back wall of thorax, initial encounter: Secondary | ICD-10-CM | POA: Diagnosis not present

## 2020-08-06 DIAGNOSIS — L821 Other seborrheic keratosis: Secondary | ICD-10-CM | POA: Diagnosis not present

## 2020-08-06 DIAGNOSIS — C44629 Squamous cell carcinoma of skin of left upper limb, including shoulder: Secondary | ICD-10-CM | POA: Diagnosis not present

## 2020-08-06 DIAGNOSIS — X32XXXD Exposure to sunlight, subsequent encounter: Secondary | ICD-10-CM | POA: Diagnosis not present

## 2020-08-06 DIAGNOSIS — D225 Melanocytic nevi of trunk: Secondary | ICD-10-CM | POA: Diagnosis not present

## 2020-08-24 ENCOUNTER — Other Ambulatory Visit: Payer: Self-pay | Admitting: Family Medicine

## 2020-08-26 ENCOUNTER — Other Ambulatory Visit: Payer: Self-pay | Admitting: Family Medicine

## 2020-09-24 DIAGNOSIS — Z08 Encounter for follow-up examination after completed treatment for malignant neoplasm: Secondary | ICD-10-CM | POA: Diagnosis not present

## 2020-09-24 DIAGNOSIS — X32XXXD Exposure to sunlight, subsequent encounter: Secondary | ICD-10-CM | POA: Diagnosis not present

## 2020-09-24 DIAGNOSIS — L570 Actinic keratosis: Secondary | ICD-10-CM | POA: Diagnosis not present

## 2020-09-24 DIAGNOSIS — Z85828 Personal history of other malignant neoplasm of skin: Secondary | ICD-10-CM | POA: Diagnosis not present

## 2020-10-04 ENCOUNTER — Other Ambulatory Visit: Payer: Self-pay | Admitting: Family Medicine

## 2020-10-06 ENCOUNTER — Telehealth: Payer: Self-pay | Admitting: Family Medicine

## 2020-10-06 MED ORDER — ALLOPURINOL 300 MG PO TABS: 300.0000 mg | ORAL_TABLET | Freq: Every day | ORAL | 3 refills | Status: AC

## 2020-10-06 NOTE — Telephone Encounter (Signed)
Please advise 

## 2020-10-06 NOTE — Telephone Encounter (Signed)
Pt's wife called requesting a refill for allopurinol. Please send to pharmacy on file. Pt phone number is [phone].

## 2020-10-26 ENCOUNTER — Telehealth: Payer: Self-pay | Admitting: Family Medicine

## 2020-10-26 MED ORDER — PRAVASTATIN SODIUM 80 MG PO TABS: 80.0000 mg | ORAL_TABLET | Freq: Every day | ORAL | 6 refills | Status: DC

## 2020-10-26 NOTE — Telephone Encounter (Signed)
Hold for Terri 

## 2020-10-26 NOTE — Addendum Note (Signed)
Addended by: Lillia Carmel on: 10/26/2020 01:32 PM   Modules accepted: Orders

## 2020-10-26 NOTE — Telephone Encounter (Signed)
Wife aware of the below message  

## 2020-10-26 NOTE — Telephone Encounter (Signed)
Pt wife called and states she wants someone to call her about her husbands medication. She has a whole list of reasons why he can't take it.   CB [phone]

## 2021-01-05 ENCOUNTER — Telehealth: Payer: Self-pay | Admitting: Family Medicine

## 2021-01-05 NOTE — Telephone Encounter (Signed)
Received call from pt,needs to pickup copy of records,has apptwith new PCP. Copy of records ready. Will sign release form when comes in to pick up records.

## 2021-01-11 ENCOUNTER — Other Ambulatory Visit: Payer: Self-pay | Admitting: Internal Medicine

## 2021-01-11 DIAGNOSIS — R911 Solitary pulmonary nodule: Secondary | ICD-10-CM

## 2021-01-17 ENCOUNTER — Ambulatory Visit
Admission: RE | Admit: 2021-01-17 | Discharge: 2021-01-17 | Disposition: A | Discharge status: Home / Self Care | Payer: Medicare PPO | Source: Ambulatory Visit | Attending: Internal Medicine | Admitting: Internal Medicine

## 2021-01-17 DIAGNOSIS — R911 Solitary pulmonary nodule: Secondary | ICD-10-CM

## 2021-05-25 DIAGNOSIS — H1010 Acute atopic conjunctivitis, unspecified eye: Secondary | ICD-10-CM | POA: Diagnosis not present

## 2021-05-25 DIAGNOSIS — E781 Pure hyperglyceridemia: Secondary | ICD-10-CM | POA: Diagnosis not present

## 2021-05-25 DIAGNOSIS — R197 Diarrhea, unspecified: Secondary | ICD-10-CM | POA: Diagnosis not present

## 2021-05-25 DIAGNOSIS — M25512 Pain in left shoulder: Secondary | ICD-10-CM | POA: Diagnosis not present

## 2021-05-25 DIAGNOSIS — R002 Palpitations: Secondary | ICD-10-CM | POA: Diagnosis not present

## 2021-05-25 DIAGNOSIS — E669 Obesity, unspecified: Secondary | ICD-10-CM | POA: Diagnosis not present

## 2021-05-25 DIAGNOSIS — H9209 Otalgia, unspecified ear: Secondary | ICD-10-CM | POA: Diagnosis not present

## 2021-05-25 DIAGNOSIS — E118 Type 2 diabetes mellitus with unspecified complications: Secondary | ICD-10-CM | POA: Diagnosis not present

## 2021-05-25 DIAGNOSIS — I10 Essential (primary) hypertension: Secondary | ICD-10-CM | POA: Diagnosis not present

## 2021-06-01 DIAGNOSIS — M25512 Pain in left shoulder: Secondary | ICD-10-CM | POA: Diagnosis not present

## 2021-06-01 DIAGNOSIS — M25561 Pain in right knee: Secondary | ICD-10-CM | POA: Diagnosis not present

## 2021-06-03 ENCOUNTER — Other Ambulatory Visit: Payer: Self-pay | Admitting: Sports Medicine

## 2021-06-03 DIAGNOSIS — M25512 Pain in left shoulder: Secondary | ICD-10-CM

## 2021-06-30 DIAGNOSIS — Z86010 Personal history of colonic polyps: Secondary | ICD-10-CM | POA: Diagnosis not present

## 2021-06-30 DIAGNOSIS — D122 Benign neoplasm of ascending colon: Secondary | ICD-10-CM | POA: Diagnosis not present

## 2021-06-30 DIAGNOSIS — D123 Benign neoplasm of transverse colon: Secondary | ICD-10-CM | POA: Diagnosis not present

## 2021-06-30 DIAGNOSIS — D175 Benign lipomatous neoplasm of intra-abdominal organs: Secondary | ICD-10-CM | POA: Diagnosis not present

## 2021-07-04 DIAGNOSIS — D123 Benign neoplasm of transverse colon: Secondary | ICD-10-CM | POA: Diagnosis not present

## 2021-07-04 DIAGNOSIS — D122 Benign neoplasm of ascending colon: Secondary | ICD-10-CM | POA: Diagnosis not present

## 2021-07-11 DIAGNOSIS — I10 Essential (primary) hypertension: Secondary | ICD-10-CM | POA: Diagnosis not present

## 2021-07-11 DIAGNOSIS — E118 Type 2 diabetes mellitus with unspecified complications: Secondary | ICD-10-CM | POA: Diagnosis not present

## 2021-07-12 ENCOUNTER — Other Ambulatory Visit: Payer: Medicare PPO

## 2021-07-23 ENCOUNTER — Ambulatory Visit
Admission: RE | Admit: 2021-07-23 | Discharge: 2021-07-23 | Disposition: A | Discharge status: Home / Self Care | Payer: Medicare PPO | Source: Ambulatory Visit | Attending: Sports Medicine | Admitting: Sports Medicine

## 2021-07-23 DIAGNOSIS — R531 Weakness: Secondary | ICD-10-CM | POA: Diagnosis not present

## 2021-07-23 DIAGNOSIS — M25512 Pain in left shoulder: Secondary | ICD-10-CM

## 2021-07-23 DIAGNOSIS — M19012 Primary osteoarthritis, left shoulder: Secondary | ICD-10-CM | POA: Diagnosis not present

## 2021-07-23 DIAGNOSIS — M75102 Unspecified rotator cuff tear or rupture of left shoulder, not specified as traumatic: Secondary | ICD-10-CM | POA: Diagnosis not present

## 2021-08-01 ENCOUNTER — Encounter: Payer: Self-pay | Admitting: Internal Medicine

## 2021-08-01 ENCOUNTER — Ambulatory Visit: Payer: Medicare PPO | Admitting: Internal Medicine

## 2021-08-01 VITALS — BP 167/103 | HR 60 | Ht 71.05 in | Wt 265.4 lb

## 2021-08-01 DIAGNOSIS — I2510 Atherosclerotic heart disease of native coronary artery without angina pectoris: Secondary | ICD-10-CM

## 2021-08-01 DIAGNOSIS — E119 Type 2 diabetes mellitus without complications: Secondary | ICD-10-CM

## 2021-08-01 DIAGNOSIS — E781 Pure hyperglyceridemia: Secondary | ICD-10-CM | POA: Diagnosis not present

## 2021-08-01 DIAGNOSIS — I10 Essential (primary) hypertension: Secondary | ICD-10-CM

## 2021-08-01 DIAGNOSIS — E785 Hyperlipidemia, unspecified: Secondary | ICD-10-CM | POA: Diagnosis not present

## 2021-08-01 MED ORDER — ROSUVASTATIN CALCIUM 20 MG PO TABS: 20.0000 mg | ORAL_TABLET | Freq: Every day | ORAL | 3 refills | Status: DC

## 2021-08-01 NOTE — Progress Notes (Signed)
? ? ?LIPID CLINIC CONSULT NOTE ? ?Chief Complaint:  ?Manage dyslipidemia ? ?Primary Care Physician: ?Emilio AspenHenderson, Jonathan A, MD ? ?Primary Cardiologist:  ?None ? ?HPI:  ?Theodore Ballard is a 66 y.o. male who is being seen today for the evaluation of dyslipidemia at the request of Emilio AspenHenderson, Jonathan A, *.  This a pleasant 66 year old male kindly referred for evaluation management of dyslipidemia.  He was seen previously by Dr. Katrinka BlazingSmith in cardiology in 2021 after having had a CT scan of the chest which showed two-vessel coronary artery calcification and aortic atherosclerosis.  He was noted to be on pravastatin at the time and his therapy has been intensified up to 80 mg daily.  Previously had tried atorvastatin which caused some myalgias.  They had discussed possibly switching to rosuvastatin.  Apparently he has been on fibrate's in the past including fenofibrate which caused jitteriness.  He was also trialed on over-the-counter fish oil which she said precipitated gout.  Apparently had tried Vascepa which she was given by Dr. Katrinka BlazingSmith but that also caused issues with his gout.  At this point, his options are quite limited.  We have discussed the possibility of a clinical trial which she would certainly qualify for.  This is based on recent lipid profile which showed total cholesterol 213, HDL 35, LDL 29 and triglycerides 1536.  He does report high triglycerides and multiple family members suggestive of a familial hypertriglyceridemia.  He does have concomitant diabetes and hypertension and recently has had difficulty controlling his blood pressure.  With regards to his diabetes, A1c is decent at 6.4%.  He tries aggressively did to lower carbohydrates however, he is not as diligent about lowering saturated fats which impact his triglycerides.  We discussed that today. ? ?PMHx:  ?Past Medical History:  ?Diagnosis Date  ? Anxiety   ? Gout   ? Hyperlipidemia   ? Hypertension   ? ? ?No past surgical history on  file. ? ?FAMHx:  ?Family History  ?Problem Relation Age of Onset  ? Cancer Mother   ? Heart disease Father   ? Hypertension Father   ? Cancer Sister   ? ? ?SOCHx:  ? reports that he has never smoked. He has never used smokeless tobacco. He reports that he does not drink alcohol and does not use drugs. ? ?ALLERGIES:  ?Allergies  ?Allergen Reactions  ? Bactrim [Sulfamethoxazole-Trimethoprim] Rash  ? Icosapent Ethyl Other (See Comments)  ?  Gout flareup  ? Penicillins Swelling and Rash  ? ? ?ROS: ?Pertinent items noted in HPI and remainder of comprehensive ROS otherwise negative. ? ?HOME MEDS: ?Current Outpatient Medications on File Prior to Visit  ?Medication Sig Dispense Refill  ? allopurinol (ZYLOPRIM) 300 MG tablet Take 1 tablet (300 mg total) by mouth daily. 90 tablet 3  ? aspirin 81 MG tablet Take 81 mg by mouth daily.    ? Cholecalciferol (VITAMIN D3 PO)     ? Cyanocobalamin (VITAMIN B 12 PO)     ? diclofenac Sodium (VOLTAREN) 1 % GEL Apply topically.    ? diphenoxylate-atropine (LOMOTIL) 2.5-0.025 MG tablet Take 1-2 tablets by mouth 4 (four) times daily as needed for diarrhea or loose stools. 30 tablet 0  ? lisinopril (ZESTRIL) 20 MG tablet Take 40 mg by mouth daily.    ? loratadine (CLARITIN) 10 MG tablet 1 tablet    ? olopatadine (PATANOL) 0.1 % ophthalmic solution Place 1 drop into both eyes 2 (two) times daily. 5 mL 12  ?  pantoprazole (PROTONIX) 40 MG tablet TAKE 1 TABLET BY MOUTH DAILY AS NEEDED 90 tablet 1  ? SYMBICORT 80-4.5 MCG/ACT inhaler Inhale into the lungs.    ? carvedilol (COREG) 12.5 MG tablet Take by mouth. (Patient not taking: Reported on 08/01/2021)    ? finasteride (PROSCAR) 5 MG tablet Take 1 tablet (5 mg total) by mouth daily. (Patient not taking: Reported on 08/01/2021) 30 tablet 3  ? glipiZIDE (GLUCOTROL XL) 2.5 MG 24 hr tablet TAKE 1 TABLET (2.5 MG TOTAL) BY MOUTH IN THE MORNING AND AT BEDTIME. (Patient not taking: Reported on 08/01/2021) 180 tablet 3  ? ?No current facility-administered  medications on file prior to visit.  ? ? ?LABS/IMAGING: ?No results found for this or any previous visit (from the past 48 hour(s)). ?No results found. ? ?LIPID PANEL: ?   ?Component Value Date/Time  ? CHOL 179 06/25/2020 0930  ? TRIG 890 (H) 06/25/2020 0930  ? HDL 39 (L) 06/25/2020 0930  ? CHOLHDL 4.6 06/25/2020 0930  ? LDLCALC  06/25/2020 0930  ?   Comment:  ?   . ?LDL cholesterol not calculated. Triglyceride levels ?greater than 400 mg/dL invalidate calculated LDL results. ?. ?Reference range: <100 ?Marland Kitchen ?Desirable range <100 mg/dL for primary prevention;   ?<70 mg/dL for patients with CHD or diabetic patients  ?with > or = 2 CHD risk factors. ?. ?LDL-C is now calculated using the Martin-Hopkins  ?calculation, which is a validated novel method providing  ?better accuracy than the Friedewald equation in the  ?estimation of LDL-C.  ?Horald Pollen et al. Lenox Ahr. 8811;031(59): 2061-2068  ?(http://education.[employer].com/faq/FAQ164) ?  ? ? ?WEIGHTS: ?Wt Readings from Last 3 Encounters:  ?08/01/21 265 lb 6.4 oz (120.4 kg)  ?11/21/19 274 lb 12.8 oz (124.6 kg)  ? ? ?VITALS: ?BP (!) 167/103   Pulse 60   Ht 5' 11.05" (1.805 m)   Wt 265 lb 6.4 oz (120.4 kg)   SpO2 96%   BMI 36.96 kg/m?  ? ?EXAM: ?General appearance: alert, no distress, and mildly obese ?Neck: no carotid bruit, no JVD, and thyroid not enlarged, symmetric, no tenderness/mass/nodules ?Lungs: clear to auscultation bilaterally ?Heart: regular rate and rhythm, S1, S2 normal, no murmur, click, rub or gallop ?Abdomen: soft, non-tender; bowel sounds normal; no masses,  no organomegaly ?Extremities: extremities normal, atraumatic, no cyanosis or edema ?Pulses: 2+ and symmetric ?Skin: Skin color, texture, turgor normal. No rashes or lesions ?Neurologic: Grossly normal ?Psych: Pleasant ? ?EKG: ?N/A ? ?ASSESSMENT: ?Familial hypertriglyceridemia ?Intolerance to fish oil/Vascepa/fenofibrate ?Type 2 diabetes-A1c 6.4% ?Coronary artery calcification/aortic  atherosclerosis ?Uncontrolled hypertension ?Moderate obesity ? ?PLAN: ?1.   Mr. Maryclare Bean has a familial hypertriglyceridemia.  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  Unfortunately he has not been able to tolerate fenofibrate and may or may not have taken gemfibrozil before.  He is on max dose of pravastatin.  Would advise switching to high intensity rosuvastatin 20 mg daily.  If this is tolerated then will consider adding gemfibrozil twice daily.  This should make some improvement in his triglycerides.  We have also given him dietary information to avoid sources of saturated fats.  Hopefully this will further improve his numbers.  Ultimately though, I think he may need to consider a clinical trial.  At this point he would qualify for clinical trial in progress to evaluate a specific RNA inhibitor of APOC3.  He has some interest in this.  I will pass along his name to our  clinical trial coordinator.  Plan follow-up with repeat lipids in 3 to 4 months. ? ?Thanks again for the kind referral ? ?Chrystie Nose, MD, [facility], FACP  ?[place]  [facility]  ?Medical Director of the Advanced Lipid Disorders &  ?Cardiovascular Risk Reduction Clinic ?Diplomate of the [employer] of Clinical Lipidology ?Attending Cardiologist  ?Direct Dial: [phone]  Fax: [phone]  ?Website:  [email or web address] ? ?Lisette Abu [place]ell ?08/01/2021, 10:33 AM ?

## 2021-08-01 NOTE — Patient Instructions (Signed)
Medication Instructions:  ?STOP pravastatin  ?START rosuvastatin (crestor) 20mg  daily ? ?Call or send MyChart message in a few weeks to let know how you are doing with med change, as Dr. [place] may prescribe gemfibrozil  ? ?*If you need a refill on your cardiac medications before your next appointment, please call your pharmacy* ? ? ?Lab Work: ?FASTING lab work in 3-4 months  ?-- complete about 1 week before your next visit with Dr. Rennis Golden  ? ?If you have labs (blood work) drawn today and your tests are completely normal, you will receive your results only by: ?MyChart Message (if you have MyChart) OR ?A paper copy in the mail ?If you have any lab test that is abnormal or we need to change your treatment, we will call you to review the results. ? ? ?Testing/Procedures: ?NONE ? ? ?Follow-Up: ?At [facility] you and your health needs are our priority.  As part of our continuing mission to provide you with exceptional heart care, we have created designated Provider Care Teams.  These Care Teams include your primary Cardiologist (physician) and Advanced Practice Providers (APPs -  Physician Assistants and Nurse Practitioners) who all work together to provide you with the care you need, when you need it. ? ?We recommend signing up for the patient portal called "MyChart".  Sign up information is provided on this After Visit Summary.  MyChart is used to connect with patients for Virtual Visits (Telemedicine).  Patients are able to view lab/test results, encounter notes, upcoming appointments, etc.  Non-urgent messages can be sent to your provider as well.   ?To learn more about what you can do with MyChart, go to [facility].   ? ?Your next appointment:   ?3-4 month(s) ? ?The format for your next appointment:   ?In Person or Virtual ? ?Provider:   ?[email or web address] MD - lipid clinic ?

## 2021-08-02 DIAGNOSIS — M25512 Pain in left shoulder: Secondary | ICD-10-CM | POA: Diagnosis not present

## 2021-08-05 DIAGNOSIS — M1711 Unilateral primary osteoarthritis, right knee: Secondary | ICD-10-CM | POA: Diagnosis not present

## 2021-08-12 ENCOUNTER — Telehealth: Payer: Self-pay

## 2021-08-12 ENCOUNTER — Other Ambulatory Visit: Payer: Self-pay

## 2021-08-12 NOTE — Telephone Encounter (Signed)
   Primary Cardiologist: Pixie Casino, MD  Chart reviewed as part of pre-operative protocol coverage. Given past medical history and time since last visit, based on ACC/AHA guidelines, Theodore Ballard would be at acceptable risk for the planned procedure without further cardiovascular testing.   His RCRI is a class I risk, 0.4% risk of major cardiac event.    I will route this recommendation to the requesting party via Epic fax function and remove from pre-op pool.  Please call with questions.  Jossie Ng. Massiah Minjares NP-C    08/12/2021, 9:51 AM [address] Office [phone] Fax [phone]

## 2021-08-12 NOTE — Telephone Encounter (Signed)
   Pre-operative Risk Assessment    Patient Name: Theodore Ballard  DOB: 1955/07/16 MRN: [id number]      Request for Surgical Clearance    Procedure:   LEFT REVERSE TOTAL SHOULDER ARTHROPLASTY   Date of Surgery:  Clearance TBD                                 Surgeon:  Ramond Marrow, M.D. Surgeon's Group or Practice Name:  [facility]  Phone number:  [phone]  Fax number:  [phone] [name]    Type of Clearance Requested:   - Medical    Type of Anesthesia:  General W/INTERSCALENE BLOCK    Additional requests/questions:    Signed, Chana Bode   08/12/2021, 9:08 AM

## 2021-08-26 DIAGNOSIS — E669 Obesity, unspecified: Secondary | ICD-10-CM | POA: Diagnosis not present

## 2021-08-26 DIAGNOSIS — Z1331 Encounter for screening for depression: Secondary | ICD-10-CM | POA: Diagnosis not present

## 2021-08-26 DIAGNOSIS — Z125 Encounter for screening for malignant neoplasm of prostate: Secondary | ICD-10-CM | POA: Diagnosis not present

## 2021-08-26 DIAGNOSIS — K219 Gastro-esophageal reflux disease without esophagitis: Secondary | ICD-10-CM | POA: Diagnosis not present

## 2021-08-26 DIAGNOSIS — E781 Pure hyperglyceridemia: Secondary | ICD-10-CM | POA: Diagnosis not present

## 2021-08-26 DIAGNOSIS — E1169 Type 2 diabetes mellitus with other specified complication: Secondary | ICD-10-CM | POA: Diagnosis not present

## 2021-08-26 DIAGNOSIS — M1711 Unilateral primary osteoarthritis, right knee: Secondary | ICD-10-CM | POA: Diagnosis not present

## 2021-08-26 DIAGNOSIS — Z23 Encounter for immunization: Secondary | ICD-10-CM | POA: Diagnosis not present

## 2021-08-26 DIAGNOSIS — I700 Atherosclerosis of aorta: Secondary | ICD-10-CM | POA: Diagnosis not present

## 2021-08-26 DIAGNOSIS — I10 Essential (primary) hypertension: Secondary | ICD-10-CM | POA: Diagnosis not present

## 2021-08-26 DIAGNOSIS — Z0000 Encounter for general adult medical examination without abnormal findings: Secondary | ICD-10-CM | POA: Diagnosis not present

## 2021-08-29 DIAGNOSIS — R350 Frequency of micturition: Secondary | ICD-10-CM | POA: Diagnosis not present

## 2021-09-05 DIAGNOSIS — R972 Elevated prostate specific antigen [PSA]: Secondary | ICD-10-CM | POA: Diagnosis not present

## 2021-09-05 DIAGNOSIS — R350 Frequency of micturition: Secondary | ICD-10-CM | POA: Diagnosis not present

## 2021-09-22 DIAGNOSIS — R972 Elevated prostate specific antigen [PSA]: Secondary | ICD-10-CM | POA: Diagnosis not present

## 2021-09-27 DIAGNOSIS — R972 Elevated prostate specific antigen [PSA]: Secondary | ICD-10-CM | POA: Diagnosis not present

## 2021-09-28 ENCOUNTER — Other Ambulatory Visit: Payer: Self-pay

## 2021-09-28 ENCOUNTER — Telehealth: Payer: Self-pay | Admitting: Internal Medicine

## 2021-09-28 DIAGNOSIS — E785 Hyperlipidemia, unspecified: Secondary | ICD-10-CM

## 2021-09-28 MED ORDER — PRAVASTATIN SODIUM 80 MG PO TABS: 80.0000 mg | ORAL_TABLET | Freq: Every evening | ORAL | 3 refills | Status: DC

## 2021-09-28 NOTE — Telephone Encounter (Signed)
Patient informed to stop taking rosuvastatin and to take pravastatin 80mg  daily. Prescription sent to preferred pharmacy. Patietn verbalized understanding.

## 2021-09-28 NOTE — Telephone Encounter (Signed)
Patient stated that 3 weeks ago, he was having weakness, pain in lower back and legs, and nausea. His PCP told him to stop taking rosuvastatin 20mg  and go back on pravastatin 80mg . He is feeling better. His urine is light yellow to clear. Shall I order pravastatin and lab work?

## 2021-09-28 NOTE — Telephone Encounter (Signed)
Ok to order pravastatin 80 mg daily and stop Crestor - list the Crestor as an intolerance please. Will need to consider something else.  Dr Rexene Edison

## 2021-09-28 NOTE — Telephone Encounter (Signed)
Pt c/o medication issue:  1. Name of Medication:  rosuvastatin (CRESTOR) 20 MG tablet   2. How are you currently taking this medication (dosage and times per day)? Not taking   3. Are you having a reaction (difficulty breathing--STAT)? no  4. What is your medication issue? Patient states he took the medication for 3 and a half weeks, but had to stop taking it due to having weakness, pain in lower back and legs, and nausea. He states he went back to taking the pravastatin since he had some left.

## 2021-09-30 DIAGNOSIS — E781 Pure hyperglyceridemia: Secondary | ICD-10-CM | POA: Diagnosis not present

## 2021-10-03 DIAGNOSIS — N138 Other obstructive and reflux uropathy: Secondary | ICD-10-CM | POA: Diagnosis not present

## 2021-10-03 DIAGNOSIS — R972 Elevated prostate specific antigen [PSA]: Secondary | ICD-10-CM | POA: Diagnosis not present

## 2021-10-03 DIAGNOSIS — N401 Enlarged prostate with lower urinary tract symptoms: Secondary | ICD-10-CM | POA: Diagnosis not present

## 2021-10-11 DIAGNOSIS — R972 Elevated prostate specific antigen [PSA]: Secondary | ICD-10-CM | POA: Diagnosis not present

## 2021-11-01 ENCOUNTER — Telehealth: Payer: Self-pay | Admitting: Internal Medicine

## 2021-11-01 NOTE — Telephone Encounter (Signed)
Pt c/o medication issue:  1. Name of Medication:   rosuvastatin (CRESTOR) 20 MG tablet [[id number]]  DISCONTINUED  2. How are you currently taking this medication (dosage and times per day)? Not taking  3. Are you having a reaction (difficulty breathing--STAT)? N/A  4. What is your medication issue?   Wife called stating patient is no longer taking this medication and the pharmacy is still showing this medication as active.  Wife is requesting the pharmacy be notified again that the patient has discontinued use of this medication so she can return this medication.

## 2021-11-01 NOTE — Telephone Encounter (Signed)
I [facility] with Gabriel Rung ,PharmD, [facility]. He explained that they had mistakenly mixed up the d/c'd crestor with the pravastatin. They now have pravastatin 80 mg qd as his correct medication.   Mrs.Staley made aware.

## 2021-11-08 ENCOUNTER — Encounter: Payer: Self-pay | Admitting: *Deleted

## 2021-11-08 DIAGNOSIS — Z006 Encounter for examination for normal comparison and control in clinical research program: Secondary | ICD-10-CM

## 2021-11-08 NOTE — Research (Addendum)
Message left for Mr Theodore Ballard about Core research. Encouraged him to call.   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 He states he has lost some weight and has changed his diet. He would like to speak with Mr Theodore Ballard before coming in. Encouraged him to call me with any questions.

## 2021-11-23 DIAGNOSIS — C44622 Squamous cell carcinoma of skin of right upper limb, including shoulder: Secondary | ICD-10-CM | POA: Diagnosis not present

## 2021-11-23 DIAGNOSIS — L570 Actinic keratosis: Secondary | ICD-10-CM | POA: Diagnosis not present

## 2021-11-23 DIAGNOSIS — L821 Other seborrheic keratosis: Secondary | ICD-10-CM | POA: Diagnosis not present

## 2021-11-23 DIAGNOSIS — X32XXXD Exposure to sunlight, subsequent encounter: Secondary | ICD-10-CM | POA: Diagnosis not present

## 2021-11-23 DIAGNOSIS — S2096XA Insect bite (nonvenomous) of unspecified parts of thorax, initial encounter: Secondary | ICD-10-CM | POA: Diagnosis not present

## 2021-12-02 ENCOUNTER — Encounter: Payer: Self-pay | Admitting: *Deleted

## 2021-12-02 ENCOUNTER — Ambulatory Visit: Payer: Medicare PPO | Admitting: Internal Medicine

## 2021-12-02 DIAGNOSIS — Z006 Encounter for examination for normal comparison and control in clinical research program: Secondary | ICD-10-CM

## 2021-12-02 NOTE — Research (Signed)
Message left for Mr Theodore Ballard about [employer] research. Encouraged him to call back.

## 2022-01-18 NOTE — Care Plan (Signed)
Ortho Bundle Case Management Note  Patient Details  Name: Theodore Ballard MRN: [id number] Date of Birth: August 27, 1955   Spoke with patient prior to surgery. He will discharge to home with family to assist. No DME. [facility]PP[facility] with [facility]. Patient and MD in agreement with plan. Choice offered                   DME Arranged:    DME Agency:     HH Arranged:    HH Agency:     Additional Comments: Please contact me with any questions of if this plan should need to change.  Ladell Heads,  [place] [facility]  [phone] 01/18/2022, 2:17 PM

## 2022-01-25 ENCOUNTER — Other Ambulatory Visit: Payer: Self-pay

## 2022-01-25 ENCOUNTER — Encounter (HOSPITAL_BASED_OUTPATIENT_CLINIC_OR_DEPARTMENT_OTHER): Payer: Self-pay | Admitting: Orthopaedic Surgery

## 2022-01-25 NOTE — Progress Notes (Signed)
   01/25/22 1324  PAT Phone Screen  Do You Have Diabetes? No  Do You Have Hypertension? Yes  Have You Ever Been to the ER for Asthma? No  Have You Taken Oral Steroids in the Past 3 Months? No  Do you Take Phenteramine or any Other Diet Drugs? No  Recent  Lab Work, EKG, CXR? No  Do you have a history of heart problems? (S)  No;Cardiologist (sees Dr Rennis Golden for hyperlipidemia and high cholesterol)  Have You Ever Had Tests on Your Heart? No  Any Recent Hospitalizations? No  Height 5\' 11"  (1.803 m)  Weight 101.2 kg  Pat Appointment Scheduled (S)  Yes (coming in for PCR, EKG)

## 2022-01-27 ENCOUNTER — Encounter (HOSPITAL_BASED_OUTPATIENT_CLINIC_OR_DEPARTMENT_OTHER)
Admission: RE | Admit: 2022-01-27 | Discharge: 2022-01-27 | Disposition: A | Discharge status: Home / Self Care | Payer: Medicare PPO | Source: Ambulatory Visit | Attending: Orthopaedic Surgery | Admitting: Orthopaedic Surgery

## 2022-01-27 DIAGNOSIS — I10 Essential (primary) hypertension: Secondary | ICD-10-CM | POA: Insufficient documentation

## 2022-01-27 DIAGNOSIS — E785 Hyperlipidemia, unspecified: Secondary | ICD-10-CM | POA: Insufficient documentation

## 2022-01-27 DIAGNOSIS — Z01818 Encounter for other preprocedural examination: Secondary | ICD-10-CM | POA: Diagnosis not present

## 2022-01-27 LAB — SURGICAL PCR SCREEN
MRSA, PCR: NEGATIVE
Staphylococcus aureus: NEGATIVE

## 2022-01-31 DIAGNOSIS — M19012 Primary osteoarthritis, left shoulder: Secondary | ICD-10-CM | POA: Diagnosis not present

## 2022-01-31 DIAGNOSIS — M25612 Stiffness of left shoulder, not elsewhere classified: Secondary | ICD-10-CM | POA: Diagnosis not present

## 2022-02-01 NOTE — H&P (Signed)
PREOPERATIVE H&P  Chief Complaint: DJD LEFT SHOULDER  HPI: Theodore Ballard is a 66 y.o. male who is scheduled for Procedure(s): REVERSE SHOULDER ARTHROPLASTY.   Patient has a past medical history significant for GERD, HLD, HTN.   The patient is a 66 year old male who has had a painful left shoulder for some time.  It has gotten worse since he has been prepping for hay.  He wants to figure out what his options are.    Symptoms are rated as moderate to severe, and have been worsening.  This is significantly impairing activities of daily living.    Please see clinic note for further details on this patient's care.    He has elected for surgical management.   Past Medical History:  Diagnosis Date   Anxiety    Arthritis    Environmental allergies    GERD (gastroesophageal reflux disease)    Gout    Hyperlipidemia    Hypertension    Past Surgical History:  Procedure Laterality Date   COLONOSCOPY     HEEL SPUR SURGERY Left    Social History   Socioeconomic History   Marital status: Married    Spouse name: Not on file   Number of children: Not on file   Years of education: Not on file   Highest education level: Not on file  Occupational History   Not on file  Tobacco Use   Smoking status: Never   Smokeless tobacco: Never  Substance and Sexual Activity   Alcohol use: Yes    Comment: occ beer   Drug use: No   Sexual activity: Yes  Other Topics Concern   Not on file  Social History Narrative   Not on file   Social Determinants of Health   Financial Resource Strain: Not on file  Food Insecurity: Not on file  Transportation Needs: Not on file  Physical Activity: Not on file  Stress: Not on file  Social Connections: Not on file   Family History  Problem Relation Age of Onset   Cancer Mother    Heart disease Father    Hypertension Father    Cancer Sister    Allergies  Allergen Reactions   Bactrim [Sulfamethoxazole-Trimethoprim] Rash    Atorvastatin     myalgias   Crestor [Rosuvastatin]    Fenofibrate     Gout flare up   Icosapent Ethyl Other (See Comments)    Gout flareup   Penicillins Swelling and Rash   Prior to Admission medications   Medication Sig Start Date End Date Taking? Authorizing Provider  allopurinol (ZYLOPRIM) 300 MG tablet Take 1 tablet (300 mg total) by mouth daily. 10/06/20  Yes Hilts, Michael, MD  aspirin 81 MG tablet Take 81 mg by mouth daily.   Yes [provider]  carvedilol (COREG) 12.5 MG tablet Take by mouth. 07/28/21  Yes [provider]  Cholecalciferol (VITAMIN D3 PO)    Yes [provider]  Cyanocobalamin (VITAMIN B 12 PO)    Yes [provider]  diclofenac Sodium (VOLTAREN) 1 % GEL Apply topically. 06/29/21  Yes [provider]  lisinopril (ZESTRIL) 20 MG tablet Take 20 mg by mouth daily.   Yes [provider]  olopatadine (PATANOL) 0.1 % ophthalmic solution Place 1 drop into both eyes 2 (two) times daily. 12/12/19  Yes Hilts, Casimiro Needle, MD  pantoprazole (PROTONIX) 40 MG tablet TAKE 1 TABLET BY MOUTH DAILY AS NEEDED 06/14/20  Yes Hilts, Casimiro Needle, MD  pravastatin (PRAVACHOL)  80 MG tablet Take 1 tablet (80 mg total) by mouth every evening. Patient taking differently: Take 40 mg by mouth every evening. 09/28/21  Yes Hilty, Lisette Abu, MD  SYMBICORT 80-4.5 MCG/ACT inhaler Inhale into the lungs. 05/25/21  Yes [provider]  diphenoxylate-atropine (LOMOTIL) 2.5-0.025 MG tablet Take 1-2 tablets by mouth 4 (four) times daily as needed for diarrhea or loose stools. 12/26/19   Hilts, Casimiro Needle, MD  loratadine (CLARITIN) 10 MG tablet 1 tablet    [provider]    ROS: All other systems have been reviewed and were otherwise negative with the exception of those mentioned in the HPI and as above.  Physical Exam: General: Alert, no acute distress Cardiovascular: No pedal edema Respiratory: No cyanosis, no use of accessory musculature GI: No  organomegaly, abdomen is soft and non-tender Skin: No lesions in the area of chief complaint Neurologic: Sensation intact distally Psychiatric: Patient is competent for consent with normal mood and affect Lymphatic: No axillary or cervical lymphadenopathy  MUSCULOSKELETAL:  Active forward elevation is 70.  Passive to 110, limited by pain.  Cuff strength is weak throughout.  Distal motor and sensory function intact.    Imaging: MRI demonstrates a full thickness supraspinatus and infraspinatus tear with proximal migration.    BMI: Estimated body mass index is 31.1 kg/m as calculated from the following:   Height as of this encounter: 5\' 11"  (1.803 m).   Weight as of this encounter: 101.2 kg.  No results found for: "ALBUMIN" Diabetes:   Patient has a diagnosis of diabetes,  Lab Results  Component Value Date   HGBA1C 5.8 (H) 03/05/2020   Smoking Status:   reports that he has never smoked. He has never used smokeless tobacco.     Assessment: DJD LEFT SHOULDER  Plan: Plan for Procedure(s): REVERSE SHOULDER ARTHROPLASTY  The risks benefits and alternatives were discussed with the patient including but not limited to the risks of nonoperative treatment, versus surgical intervention including infection, bleeding, nerve injury,  blood clots, cardiopulmonary complications, morbidity, mortality, among others, and they were willing to proceed.   We additionally specifically discussed risks of axillary nerve injury, infection, periprosthetic fracture, continued pain and longevity of implants prior to beginning procedure.    Patient will be closely monitored in PACU for medical stabilization and pain control. If found stable in PACU, patient may be discharged home with outpatient follow-up. If any concerns regarding patient's stabilization patient will be admitted for observation after surgery. The patient is planning to be discharged home with outpatient PT.   The patient acknowledged  the explanation, agreed to proceed with the plan and consent was signed.   He received operative clearance from his PCP, Dr. 03/07/2020, and cardiologist, Dr. Orson Aloe.   Operative Plan: Left reverse total shoulder arthroplasty Discharge Medications: Standard DVT Prophylaxis: Aspirin Physical Therapy: Outpatient PT Special Discharge needs: Sling. IceMan   Rennis Golden, PA-C  02/01/2022 6:23 PM

## 2022-02-01 NOTE — Anesthesia Preprocedure Evaluation (Signed)
Anesthesia Evaluation  Patient identified by MRN, date of birth, ID band Patient awake    Reviewed: Allergy & Precautions, H&P , NPO status , Patient's Chart, lab work & pertinent test results  Airway Mallampati: II  TM Distance: >3 FB Neck ROM: Full    Dental no notable dental hx. (+) Teeth Intact, Dental Advisory Given   Pulmonary neg pulmonary ROS   Pulmonary exam normal breath sounds clear to auscultation       Cardiovascular Exercise Tolerance: Good hypertension, Pt. on medications and Pt. on home beta blockers  Rhythm:Regular Rate:Normal     Neuro/Psych   Anxiety     negative neurological ROS     GI/Hepatic Neg liver ROS,GERD  Medicated,,  Endo/Other  diabetes    Renal/GU negative Renal ROS  negative genitourinary   Musculoskeletal  (+) Arthritis , Osteoarthritis,    Abdominal   Peds  Hematology negative hematology ROS (+)   Anesthesia Other Findings   Reproductive/Obstetrics negative OB ROS                             Anesthesia Physical Anesthesia Plan  ASA: 2  Anesthesia Plan: General   Post-op Pain Management: Regional block* and Tylenol PO (pre-op)*   Induction: Intravenous  PONV Risk Score and Plan: 2 and Ondansetron, Dexamethasone and Midazolam  Airway Management Planned: Oral ETT  Additional Equipment:   Intra-op Plan:   Post-operative Plan: Extubation in OR  Informed Consent: I have reviewed the patients History and Physical, chart, labs and discussed the procedure including the risks, benefits and alternatives for the proposed anesthesia with the patient or authorized representative who has indicated his/her understanding and acceptance.     Dental advisory given  Plan Discussed with: CRNA  Anesthesia Plan Comments:        Anesthesia Quick Evaluation

## 2022-02-02 ENCOUNTER — Ambulatory Visit (HOSPITAL_BASED_OUTPATIENT_CLINIC_OR_DEPARTMENT_OTHER): Payer: Medicare PPO | Admitting: Certified Registered"

## 2022-02-02 ENCOUNTER — Encounter (HOSPITAL_BASED_OUTPATIENT_CLINIC_OR_DEPARTMENT_OTHER): Payer: Self-pay | Admitting: Orthopaedic Surgery

## 2022-02-02 ENCOUNTER — Other Ambulatory Visit: Payer: Self-pay

## 2022-02-02 ENCOUNTER — Ambulatory Visit (HOSPITAL_COMMUNITY): Payer: Medicare PPO

## 2022-02-02 ENCOUNTER — Ambulatory Visit (HOSPITAL_BASED_OUTPATIENT_CLINIC_OR_DEPARTMENT_OTHER)
Admission: RE | Admit: 2022-02-02 | Discharge: 2022-02-02 | Disposition: A | Discharge status: Home / Self Care | Payer: Medicare PPO | Attending: Orthopaedic Surgery | Admitting: Orthopaedic Surgery

## 2022-02-02 ENCOUNTER — Encounter (HOSPITAL_BASED_OUTPATIENT_CLINIC_OR_DEPARTMENT_OTHER)
Admission: RE | Disposition: A | Discharge status: Home / Self Care | Payer: Self-pay | Source: Home / Self Care | Attending: Orthopaedic Surgery

## 2022-02-02 DIAGNOSIS — Z01818 Encounter for other preprocedural examination: Secondary | ICD-10-CM

## 2022-02-02 DIAGNOSIS — K219 Gastro-esophageal reflux disease without esophagitis: Secondary | ICD-10-CM | POA: Insufficient documentation

## 2022-02-02 DIAGNOSIS — E119 Type 2 diabetes mellitus without complications: Secondary | ICD-10-CM | POA: Diagnosis not present

## 2022-02-02 DIAGNOSIS — Z471 Aftercare following joint replacement surgery: Secondary | ICD-10-CM | POA: Diagnosis not present

## 2022-02-02 DIAGNOSIS — M12812 Other specific arthropathies, not elsewhere classified, left shoulder: Secondary | ICD-10-CM | POA: Diagnosis not present

## 2022-02-02 DIAGNOSIS — G8918 Other acute postprocedural pain: Secondary | ICD-10-CM | POA: Diagnosis not present

## 2022-02-02 DIAGNOSIS — I10 Essential (primary) hypertension: Secondary | ICD-10-CM | POA: Diagnosis not present

## 2022-02-02 DIAGNOSIS — E785 Hyperlipidemia, unspecified: Secondary | ICD-10-CM | POA: Insufficient documentation

## 2022-02-02 DIAGNOSIS — M19012 Primary osteoarthritis, left shoulder: Secondary | ICD-10-CM | POA: Diagnosis not present

## 2022-02-02 DIAGNOSIS — Z96612 Presence of left artificial shoulder joint: Secondary | ICD-10-CM | POA: Diagnosis not present

## 2022-02-02 DIAGNOSIS — M75102 Unspecified rotator cuff tear or rupture of left shoulder, not specified as traumatic: Secondary | ICD-10-CM

## 2022-02-02 HISTORY — PX: REVERSE SHOULDER ARTHROPLASTY: SHX5054

## 2022-02-02 HISTORY — DX: Other allergy status, other than to drugs and biological substances: Z91.09

## 2022-02-02 HISTORY — DX: Gastro-esophageal reflux disease without esophagitis: K21.9

## 2022-02-02 HISTORY — DX: Unspecified osteoarthritis, unspecified site: M19.90

## 2022-02-02 SURGERY — ARTHROPLASTY, SHOULDER, TOTAL, REVERSE
Anesthesia: General | Site: Shoulder | Laterality: Left

## 2022-02-02 MED ORDER — MELOXICAM 15 MG PO TABS: 15.0000 mg | ORAL_TABLET | Freq: Every day | ORAL | 0 refills | Status: DC

## 2022-02-02 MED ORDER — BUPIVACAINE LIPOSOME 1.3 % IJ SUSP
INTRAMUSCULAR | Status: DC | PRN
  Administered 2022-02-02: 10 mL via PERINEURAL

## 2022-02-02 MED ORDER — FENTANYL CITRATE (PF) 100 MCG/2ML IJ SOLN
INTRAMUSCULAR | Status: DC | PRN
  Administered 2022-02-02: 50 ug via INTRAVENOUS

## 2022-02-02 MED ORDER — FENTANYL CITRATE (PF) 100 MCG/2ML IJ SOLN
INTRAMUSCULAR | Status: AC
  Filled 2022-02-02: qty 2

## 2022-02-02 MED ORDER — SUGAMMADEX SODIUM 500 MG/5ML IV SOLN
INTRAVENOUS | Status: DC | PRN
  Administered 2022-02-02: 250 mg via INTRAVENOUS

## 2022-02-02 MED ORDER — OXYCODONE HCL 5 MG PO TABS
ORAL_TABLET | ORAL | Status: AC
  Filled 2022-02-02: qty 1

## 2022-02-02 MED ORDER — ACETAMINOPHEN 500 MG PO TABS: 1000.0000 mg | ORAL_TABLET | Freq: Once | ORAL | Status: AC

## 2022-02-02 MED ORDER — SUGAMMADEX SODIUM 500 MG/5ML IV SOLN
INTRAVENOUS | Status: AC
  Filled 2022-02-02: qty 5

## 2022-02-02 MED ORDER — ACETAMINOPHEN 500 MG PO TABS
ORAL_TABLET | ORAL | Status: AC
  Filled 2022-02-02: qty 2

## 2022-02-02 MED ORDER — BUPIVACAINE-EPINEPHRINE (PF) 0.5% -1:200000 IJ SOLN
INTRAMUSCULAR | Status: DC | PRN
  Administered 2022-02-02: 15 mL via PERINEURAL

## 2022-02-02 MED ORDER — PHENYLEPHRINE HCL-NACL 20-0.9 MG/250ML-% IV SOLN
INTRAVENOUS | Status: DC | PRN
  Administered 2022-02-02: 25 ug/min via INTRAVENOUS

## 2022-02-02 MED ORDER — EPHEDRINE SULFATE (PRESSORS) 50 MG/ML IJ SOLN
INTRAMUSCULAR | Status: DC | PRN
  Administered 2022-02-02: 10 mg via INTRAVENOUS
  Administered 2022-02-02: 5 mg via INTRAVENOUS
  Administered 2022-02-02: 10 mg via INTRAVENOUS

## 2022-02-02 MED ORDER — ATROPINE SULFATE 0.4 MG/ML IV SOLN
INTRAVENOUS | Status: AC
  Filled 2022-02-02: qty 1

## 2022-02-02 MED ORDER — ASPIRIN 81 MG PO CHEW: 81.0000 mg | CHEWABLE_TABLET | Freq: Two times a day (BID) | ORAL | 0 refills | Status: AC

## 2022-02-02 MED ORDER — PROPOFOL 10 MG/ML IV BOLUS
INTRAVENOUS | Status: AC
  Filled 2022-02-02: qty 20

## 2022-02-02 MED ORDER — DEXAMETHASONE SODIUM PHOSPHATE 10 MG/ML IJ SOLN
INTRAMUSCULAR | Status: DC | PRN
  Administered 2022-02-02: 5 mg via INTRAVENOUS

## 2022-02-02 MED ORDER — EPHEDRINE 5 MG/ML INJ
INTRAVENOUS | Status: AC
  Filled 2022-02-02: qty 5

## 2022-02-02 MED ORDER — STERILE WATER FOR IRRIGATION IR SOLN
Status: DC | PRN
  Administered 2022-02-02: 1000 mL

## 2022-02-02 MED ORDER — VANCOMYCIN HCL 1000 MG IV SOLR
INTRAVENOUS | Status: AC
  Filled 2022-02-02: qty 20

## 2022-02-02 MED ORDER — ACETAMINOPHEN 500 MG PO TABS: 1000.0000 mg | ORAL_TABLET | Freq: Three times a day (TID) | ORAL | 0 refills | Status: AC

## 2022-02-02 MED ORDER — PROPOFOL 10 MG/ML IV BOLUS
INTRAVENOUS | Status: DC | PRN
  Administered 2022-02-02: 150 mg via INTRAVENOUS

## 2022-02-02 MED ORDER — PHENYLEPHRINE HCL (PRESSORS) 10 MG/ML IV SOLN
INTRAVENOUS | Status: AC
  Filled 2022-02-02: qty 1

## 2022-02-02 MED ORDER — CEFAZOLIN SODIUM-DEXTROSE 2-4 GM/100ML-% IV SOLN
INTRAVENOUS | Status: AC
  Filled 2022-02-02: qty 100

## 2022-02-02 MED ORDER — GABAPENTIN 300 MG PO CAPS
300.0000 mg | ORAL_CAPSULE | Freq: Once | ORAL | Status: AC
  Administered 2022-02-02: 300 mg via ORAL

## 2022-02-02 MED ORDER — OXYCODONE HCL 5 MG PO TABS
5.0000 mg | ORAL_TABLET | Freq: Once | ORAL | Status: AC
  Administered 2022-02-02: 5 mg via ORAL

## 2022-02-02 MED ORDER — GABAPENTIN 300 MG PO CAPS
ORAL_CAPSULE | ORAL | Status: AC
  Filled 2022-02-02: qty 1

## 2022-02-02 MED ORDER — DEXAMETHASONE SODIUM PHOSPHATE 10 MG/ML IJ SOLN
INTRAMUSCULAR | Status: AC
  Filled 2022-02-02: qty 1

## 2022-02-02 MED ORDER — MIDAZOLAM HCL 2 MG/2ML IJ SOLN
INTRAMUSCULAR | Status: AC
  Filled 2022-02-02: qty 2

## 2022-02-02 MED ORDER — ONDANSETRON HCL 4 MG/2ML IJ SOLN
INTRAMUSCULAR | Status: AC
  Filled 2022-02-02: qty 2

## 2022-02-02 MED ORDER — ROCURONIUM BROMIDE 100 MG/10ML IV SOLN
INTRAVENOUS | Status: DC | PRN
  Administered 2022-02-02: 50 mg via INTRAVENOUS

## 2022-02-02 MED ORDER — EPINEPHRINE PF 1 MG/ML IJ SOLN
INTRAMUSCULAR | Status: AC
  Filled 2022-02-02: qty 1

## 2022-02-02 MED ORDER — TRANEXAMIC ACID-NACL 1000-0.7 MG/100ML-% IV SOLN
1000.0000 mg | INTRAVENOUS | Status: AC
  Administered 2022-02-02: 1000 mg via INTRAVENOUS

## 2022-02-02 MED ORDER — ONDANSETRON HCL 4 MG PO TABS: 4.0000 mg | ORAL_TABLET | Freq: Three times a day (TID) | ORAL | 0 refills | Status: AC | PRN

## 2022-02-02 MED ORDER — ACETAMINOPHEN 500 MG PO TABS
1000.0000 mg | ORAL_TABLET | Freq: Once | ORAL | Status: AC
  Administered 2022-02-02: 1000 mg via ORAL

## 2022-02-02 MED ORDER — FENTANYL CITRATE (PF) 100 MCG/2ML IJ SOLN
50.0000 ug | Freq: Once | INTRAMUSCULAR | Status: AC
  Administered 2022-02-02: 50 ug via INTRAVENOUS

## 2022-02-02 MED ORDER — FENTANYL CITRATE (PF) 100 MCG/2ML IJ SOLN
25.0000 ug | INTRAMUSCULAR | Status: DC | PRN
  Administered 2022-02-02 (×3): 50 ug via INTRAVENOUS

## 2022-02-02 MED ORDER — SODIUM CHLORIDE 0.9 % IR SOLN
Status: DC | PRN
  Administered 2022-02-02: 1000 mL

## 2022-02-02 MED ORDER — BUPIVACAINE-EPINEPHRINE (PF) 0.5% -1:200000 IJ SOLN: INTRAMUSCULAR | Status: DC | PRN

## 2022-02-02 MED ORDER — TRANEXAMIC ACID-NACL 1000-0.7 MG/100ML-% IV SOLN
INTRAVENOUS | Status: AC
  Filled 2022-02-02: qty 100

## 2022-02-02 MED ORDER — CEFAZOLIN SODIUM-DEXTROSE 2-4 GM/100ML-% IV SOLN
2.0000 g | INTRAVENOUS | Status: AC
  Administered 2022-02-02: 2 g via INTRAVENOUS

## 2022-02-02 MED ORDER — VANCOMYCIN HCL 1000 MG IV SOLR
INTRAVENOUS | Status: DC | PRN
  Administered 2022-02-02: 1000 mg via TOPICAL

## 2022-02-02 MED ORDER — LACTATED RINGERS IV SOLN: INTRAVENOUS | Status: DC

## 2022-02-02 MED ORDER — ROCURONIUM BROMIDE 10 MG/ML (PF) SYRINGE
PREFILLED_SYRINGE | INTRAVENOUS | Status: AC
  Filled 2022-02-02: qty 10

## 2022-02-02 MED ORDER — MIDAZOLAM HCL 2 MG/2ML IJ SOLN
1.0000 mg | Freq: Once | INTRAMUSCULAR | Status: AC
  Administered 2022-02-02: 1 mg via INTRAVENOUS

## 2022-02-02 MED ORDER — LIDOCAINE 2% (20 MG/ML) 5 ML SYRINGE
INTRAMUSCULAR | Status: AC
  Filled 2022-02-02: qty 5

## 2022-02-02 MED ORDER — ONDANSETRON HCL 4 MG/2ML IJ SOLN
INTRAMUSCULAR | Status: DC | PRN
  Administered 2022-02-02: 4 mg via INTRAVENOUS

## 2022-02-02 MED ORDER — OXYCODONE HCL 5 MG PO TABS: ORAL_TABLET | ORAL | 0 refills | Status: AC

## 2022-02-02 SURGICAL SUPPLY — 67 items
AID PSTN UNV HD RSTRNT DISP (MISCELLANEOUS) ×1
APL PRP STRL LF DISP 70% ISPRP (MISCELLANEOUS) ×2
BIT DRILL 3.2 PERIPHERAL SCREW (BIT) IMPLANT
BLADE SAW SGTL 73X25 THK (BLADE) ×1 IMPLANT
BLADE SURG 10 STRL SS (BLADE) IMPLANT
BLADE SURG 15 STRL LF DISP TIS (BLADE) IMPLANT
BLADE SURG 15 STRL SS (BLADE)
BRUSH SCRUB EZ PLAIN DRY (MISCELLANEOUS) ×1 IMPLANT
BSPLAT GLND +3 29 (Joint) ×1 IMPLANT
CHLORAPREP W/TINT 26 (MISCELLANEOUS) ×1 IMPLANT
CLSR STERI-STRIP ANTIMIC 1/2X4 (GAUZE/BANDAGES/DRESSINGS) ×1 IMPLANT
COOLER ICEMAN CLASSIC (MISCELLANEOUS) ×1 IMPLANT
COVER BACK TABLE 60X90IN (DRAPES) ×1 IMPLANT
COVER MAYO STAND STRL (DRAPES) ×1 IMPLANT
DRAPE IMP U-DRAPE 54X76 (DRAPES) IMPLANT
DRAPE INCISE IOBAN 66X45 STRL (DRAPES) ×1 IMPLANT
DRAPE POUCH INSTRU U-SHP 10X18 (DRAPES) ×1 IMPLANT
DRAPE U-SHAPE 76X120 STRL (DRAPES) ×2 IMPLANT
DRSG AQUACEL AG ADV 3.5X 6 (GAUZE/BANDAGES/DRESSINGS) ×1 IMPLANT
ELECT BLADE 4.0 EZ CLEAN MEGAD (MISCELLANEOUS) ×1
ELECT REM PT RETURN 9FT ADLT (ELECTROSURGICAL) ×1
ELECTRODE BLDE 4.0 EZ CLN MEGD (MISCELLANEOUS) ×1 IMPLANT
ELECTRODE REM PT RTRN 9FT ADLT (ELECTROSURGICAL) ×1 IMPLANT
FACESHIELD WRAPAROUND (MASK) ×2 IMPLANT
FACESHIELD WRAPAROUND OR TEAM (MASK) ×2 IMPLANT
GLENOID BASEPLATE 29 +3 (Joint) IMPLANT
GLENOSPHERE PERFORM 39 3 (Shoulder) ×1 IMPLANT
GLOVE BIO SURGEON STRL SZ 6.5 (GLOVE) ×2 IMPLANT
GLOVE BIOGEL PI IND STRL 6.5 (GLOVE) ×1 IMPLANT
GLOVE BIOGEL PI IND STRL 8 (GLOVE) ×1 IMPLANT
GLOVE ECLIPSE 8.0 STRL XLNG CF (GLOVE) ×2 IMPLANT
GOWN STRL REUS W/ TWL LRG LVL3 (GOWN DISPOSABLE) ×2 IMPLANT
GOWN STRL REUS W/TWL LRG LVL3 (GOWN DISPOSABLE) ×2
GOWN STRL REUS W/TWL XL LVL3 (GOWN DISPOSABLE) ×1 IMPLANT
GUIDE PIN 3X75 SHOULDER (PIN) ×1
GUIDEWIRE GLENOID 2.5X220 (WIRE) IMPLANT
HANDPIECE INTERPULSE COAX TIP (DISPOSABLE) ×1
INSERT HUM PERF 3/4 39 +0 (Insert) IMPLANT
KIT STABILIZATION SHOULDER (MISCELLANEOUS) ×1 IMPLANT
MANIFOLD NEPTUNE II (INSTRUMENTS) ×1 IMPLANT
PACK BASIN DAY SURGERY FS (CUSTOM PROCEDURE TRAY) ×1 IMPLANT
PACK SHOULDER (CUSTOM PROCEDURE TRAY) ×1 IMPLANT
PAD COLD SHLDR WRAP-ON (PAD) ×1 IMPLANT
PIN GUIDE 3X75 SHOULDER (PIN) IMPLANT
RESTRAINT HEAD UNIVERSAL NS (MISCELLANEOUS) ×1 IMPLANT
SCREW 5.5X22 (Screw) IMPLANT
SCREW BONE 6.5X40 SM (Screw) IMPLANT
SCREW PERIPHERAL 5.0X34 (Screw) IMPLANT
SET HNDPC FAN SPRY TIP SCT (DISPOSABLE) ×1 IMPLANT
SHEET MEDIUM DRAPE 40X70 STRL (DRAPES) ×1 IMPLANT
SLEEVE SCD COMPRESS KNEE MED (STOCKING) ×1 IMPLANT
SPHERE GLENOD +3X39XLATERALIZE (Shoulder) IMPLANT
SPHR GLND +3X39XLATERALIZE (Shoulder) ×1 IMPLANT
SPIKE FLUID TRANSFER (MISCELLANEOUS) IMPLANT
SPONGE T-LAP 18X18 ~~LOC~~+RFID (SPONGE) ×1 IMPLANT
STEM HUM PERFORM 4 (Stem) IMPLANT
SUT ETHIBOND 2 V 37 (SUTURE) ×1 IMPLANT
SUT ETHIBOND NAB CT1 #1 30IN (SUTURE) ×1 IMPLANT
SUT FIBERWIRE #5 38 CONV NDL (SUTURE) ×4
SUT MNCRL AB 4-0 PS2 18 (SUTURE) ×1 IMPLANT
SUT VIC AB 0 CT1 27 (SUTURE)
SUT VIC AB 0 CT1 27XBRD ANBCTR (SUTURE) IMPLANT
SUT VIC AB 3-0 SH 27 (SUTURE) ×1
SUT VIC AB 3-0 SH 27X BRD (SUTURE) ×1 IMPLANT
SUTURE FIBERWR #5 38 CONV NDL (SUTURE) ×4 IMPLANT
TOWEL GREEN STERILE FF (TOWEL DISPOSABLE) ×3 IMPLANT
TUBE SUCTION HIGH CAP CLEAR NV (SUCTIONS) ×1 IMPLANT

## 2022-02-02 NOTE — Anesthesia Postprocedure Evaluation (Signed)
Anesthesia Post Note  Patient: Theodore Ballard  Procedure(s) Performed: REVERSE SHOULDER ARTHROPLASTY (Left: Shoulder)     Patient location during evaluation: PACU Anesthesia Type: General and Regional Level of consciousness: awake and alert Pain management: pain level controlled Vital Signs Assessment: post-procedure vital signs reviewed and stable Respiratory status: spontaneous breathing, nonlabored ventilation and respiratory function stable Cardiovascular status: blood pressure returned to baseline and stable Postop Assessment: no apparent nausea or vomiting Anesthetic complications: no  No notable events documented.  Last Vitals:  Vitals:   02/02/22 1055 02/02/22 1119  BP: (!) 148/88 (!) 149/86  Pulse: 65 62  Resp: 10 16  Temp:  36.4 C  SpO2: 93% 94%    Last Pain:  Vitals:   02/02/22 1130  TempSrc:   PainSc: 3                  Lela Gell,W. EDMOND

## 2022-02-02 NOTE — Transfer of Care (Signed)
Immediate Anesthesia Transfer of Care Note  Patient: Theodore Ballard  Procedure(s) Performed: REVERSE SHOULDER ARTHROPLASTY (Left: Shoulder)  Patient Location: PACU  Anesthesia Type:GA combined with regional for post-op pain  Level of Consciousness: awake, alert , and oriented  Airway & Oxygen Therapy: Patient Spontanous Breathing and Patient connected to face mask oxygen  Post-op Assessment: Report given to RN and Post -op Vital signs reviewed and stable  Post vital signs: Reviewed and stable  Last Vitals:  Vitals Value Taken Time  BP 146/90 02/02/22 1006  Temp    Pulse 66 02/02/22 1009  Resp 12 02/02/22 1009  SpO2 94 % 02/02/22 1009  Vitals shown include unvalidated device data.  Last Pain:  Vitals:   02/02/22 0642  TempSrc: Oral  PainSc: 8       Patients Stated Pain Goal: 5 (02/02/22 5790)  Complications: No notable events documented.

## 2022-02-02 NOTE — Anesthesia Procedure Notes (Signed)
Anesthesia Regional Block: Interscalene brachial plexus block   Pre-Anesthetic Checklist: , timeout performed,  Correct Patient, Correct Site, Correct Laterality,  Correct Procedure, Correct Position, site marked,  Risks and benefits discussed,  Pre-op evaluation,  At surgeon's request and post-op pain management  Laterality: Left  Prep: Maximum Sterile Barrier Precautions used, chloraprep       Needles:  Injection technique: Single-shot  Needle Type: Echogenic Stimulator Needle     Needle Length: 5cm  Needle Gauge: 22     Additional Needles:   Procedures:,,,, ultrasound used (permanent image in chart),,    Narrative:  Start time: 02/02/2022 7:09 AM End time: 02/02/2022 7:19 AM Injection made incrementally with aspirations every 5 mL.  Performed by: Personally  Anesthesiologist: Gaynelle Adu, MD

## 2022-02-02 NOTE — Interval H&P Note (Signed)
All questions answered

## 2022-02-02 NOTE — Op Note (Signed)
Orthopaedic Surgery Operative Note (CSN: [id number])  [name]  Dec 16, 1955 Date of Surgery: 02/02/2022   Diagnoses:  Left cuff tear arthropathy  Procedure: Left reverse lateralized total Shoulder Arthroplasty   Operative Finding Successful completion of planned procedure.  Patient had no subscap supra or infraspinatus and only a small amount of the teres minor still intact.  There was some anterior capsule that was able to be repaired eventually.  The patient's glenoid inferiorly had osteophyte that fragmented off.  Based on this we added 2 additional screws and we will slow down therapy.  Overall the case was routine otherwise.    Post-operative plan: The patient will be NWB in sling.  The patient will be will be discharged from PACU if continues to be stable as was plan prior to surgery.  DVT prophylaxis Aspirin 81 mg twice daily for 6 weeks.  Pain control with PRN pain medication preferring oral medicines.  Follow up plan will be scheduled in approximately 7 days for incision check and XR.  Physical therapy to start with a single visit and working on elbow and wrist range of motion avoiding shoulder motion for the first 4 months.  Implants: Tornier 4 stem, 0 polyethylene, 39+3 glenosphere, 29+3 baseplate with a 35 center screw and 4 peripheral screws  Post-Op Diagnosis: Same Surgeons:Primary: Bjorn Pippin, MD Assistants:Caroline McBane PA-C Location: [facility] OR ROOM 6 Anesthesia: General with Exparel Interscalene Antibiotics: Ancef 2g preop, Vancomycin 1000mg  locally Tourniquet time: None Estimated Blood Loss: 100 Complications: None Specimens: None Implants: Implant Name Type Inv. Item Serial No. Manufacturer Lot No. LRB No. Used Action  GLENOID BASEPLATE 29 +3 - [id number] Joint GLENOID BASEPLATE 29 +3 [id number] [employer]  Left 1 Implanted  GLENOSPHERE PERFORM 39 3 - Shoulder GLENOSPHERE PERFORM 39 3 [id number] [employer]  Left 1 Implanted  SCREW  5.5X22 - [id number] Screw SCREW 5.5X22  [employer]  Left 3 Implanted  STEM HUM PERFORM 4 - [id number] Stem STEM HUM PERFORM 4 [id number] [employer]  Left 1 Implanted  INSERT HUM PERF 3/4 39 +0 - [id number] Insert INSERT HUM PERF 3/4 39 +0 [id number] [employer]  Left 1 Implanted  SCREW BONE 6.5X40 SM - [id number] Screw SCREW BONE 6.5X40 SM  [employer]  Left 1 Implanted  SCREW PERIPHERAL 5.0X34 - [id number] Screw SCREW PERIPHERAL 5.0X34  [employer]  Left 1 Implanted    Indications for Surgery:   Theodore Ballard is a 66 y.o. adult with end-stage cuff tear arthropathy.  Benefits and risks of operative and nonoperative management were discussed prior to surgery with patient/guardian(s) and informed consent form was completed.  Infection and need for further surgery were discussed as was prosthetic stability and cuff issues.  We additionally specifically discussed risks of axillary nerve injury, infection, periprosthetic fracture, continued pain and longevity of implants prior to beginning procedure.      Procedure:   The patient was identified in the preoperative holding area where the surgical site was marked. Block placed by anesthesia with exparel.  The patient was taken to the OR where a procedural timeout was called and the above noted anesthesia was induced.  The patient was positioned beachchair on allen table with spider arm positioner.  Preoperative antibiotics were dosed.  The patient's left shoulder was prepped and draped in the usual sterile fashion.  A second preoperative timeout was called.       Standard deltopectoral approach was performed with a #10 blade. We dissected down to  the subcutaneous tissues and the cephalic vein was taken laterally with the deltoid. Clavipectoral fascia was incised in line with the incision. Deep retractors were placed. The long of the biceps tendon was identified and there was significant tenosynovitis present.  Tenodesis was performed to the  pectoralis tendon with #2 Ethibond. The remaining biceps was followed up into the rotator interval where it was released.   The subscapularis remnant was taken down in a full thickness layer with capsule along the humeral neck extending inferiorly around the humeral head. We continued releasing the capsule directly off of the osteophytes inferiorly all the way around the corner. This allowed [place] to dislocate the humeral head.   The humeral head had evidence of severe osteoarthritic wear with full-thickness cartilage loss and exposed subchondral bone.  The rotator cuff was carefully examined and noted to be irreperably torn.  The decision was confirmed that a reverse total shoulder was indicated for this patient.  There were osteophytes along the inferior humeral neck. The osteophytes were removed with an osteotome and a rongeur.  Osteophytes were removed with a rongeur and an osteotome and the anatomic neck was well visualized.     A humeral cutting guide was used extra medullary with a pin to help control version. The version was set at 20 of retroversion. Humeral osteotomy was performed with an oscillating saw. The head fragment was passed off the back table.  A cut protector plate was placed.  The subscapularis was again identified and immediately we took care to palpate the axillary nerve anteriorly and verify its position with gentle palpation as well as the tug test.  We then released the SGHL with bovie cautery prior to placing a curved mayo at the junction of the anterior glenoid well above the axillary nerve and bluntly dissecting the subscapularis from the capsule.  We then carefully protected the axillary nerve as we gently released the inferior capsule to fully mobilize the subscapularis.  An anterior deltoid retractor was then placed as well as a small Hohmann retractor superiorly.   The glenoid was inspected and had evidence of severe osteoarthritic wear with full-thickness cartilage loss  and exposed subchondral bone.   The remaining labrum was removed circumferentially taking great care not to disrupt the posterior capsule.   The glenoid drill guide was placed and used to drill a guide pin in the center, inferior position. The glenoid face was then reamed concentrically over the guide wire. The center hole was drilled over the guidepin in a near anatomic angle of version. Next the glenoid vault was drilled back to a depth of 35 mm.  We tapped and then placed a 55mm size baseplate with additional 45mm lateralization was selected with a 6.5 mm x 35 mm length central screw.  The base plate was screwed into the glenoid vault obtaining secure fixation. We next placed superior and inferior locking screws for additional fixation.  We used our peripheral reamer but there was fragmentation of the inferior aspect of the glenoid and based on this we felt that 2 additional screws should be placed.  We tried to remove any impinging bone however we did not want to dissect far medial and inferior to the glenoid to avoid damage the axillary nerve.  Thus we left some fragments of bone in place.  Next a 39 +3 mm glenosphere was selected and impacted onto the baseplate. The center screw was tightened.  We turned attention back to the humeral side. The cut protector was removed.  We used the perform humeral sizing block to select the appropriate size which for this patient was a 4.  We then placed our center pin and reamed over it concentrically obtaining appropriate inset.  We then used our lateralizing chisel to prepare the lateral aspect of the humerus.  At that point we selected the appropriate implant trialing a 40.  Using this trial implant we trialed multiple polyethylene sizes settling on a 0 which provided good stability and range of motion without excess soft tissue tension. The offset was dialed in to match the normal anatomy. The shoulder was trialed.  There was good ROM in all planes and the shoulder  was stable with no inferior translation.  The real humeral implants were opened after again confirming sizes.  The trial was removed. #5 Fiberwire x4 sutures passed through the humeral neck for subscap repair. The humeral component was press-fit obtaining a secure fit. The joint was reduced and thoroughly irrigated with pulsatile lavage. Subscap was repaired back with #5 Fiberwire sutures through bone tunnels. Hemostasis was obtained. The deltopectoral interval was reapproximated with #1 Ethibond. The subcutaneous tissues were closed with 2-0 Vicryl and the skin was closed with running monocryl.    The wounds were cleaned and dried and an Aquacel dressing was placed. The drapes taken down. The arm was placed into sling with abduction pillow. Patient was awakened, extubated, and transferred to the recovery room in stable condition. There were no intraoperative complications. The sponge, needle, and attention counts were  correct at the end of the case.     Alfonse Alpers, PA-C, present and scrubbed throughout the case, critical for completion in a timely fashion, and for retraction, instrumentation, closure.

## 2022-02-02 NOTE — Progress Notes (Signed)
Assisted Dr. Edmond Fitzgerald with left, interscalene , ultrasound guided block. Side rails up, monitors on throughout procedure. See vital signs in flow sheet. Tolerated Procedure well. 

## 2022-02-02 NOTE — Discharge Instructions (Addendum)
Ramond Marrow MD, MPH Alfonse Alpers, PA-C [facility] [address], [address] [phone] (tel)   [phone] (fax)   POST-OPERATIVE INSTRUCTIONS - TOTAL SHOULDER REPLACEMENT    WOUND CARE You may leave the operative dressing in place until your follow-up appointment. KEEP THE INCISIONS CLEAN AND DRY. There may be a small amount of fluid/bleeding leaking at the surgical site. This is normal after surgery.  If it fills with liquid or blood please call us immediately to change it for you. Use the provided ice machine or Ice packs as often as possible for the first 3-4 days, then as needed for pain relief.   Keep a layer of cloth or a shirt between your skin and the cooling unit to prevent frost bite as it can get very cold.  SHOWERING: - You may shower on Post-Op Day #2.  - The dressing is water resistant but do not scrub it as it may start to peel up.   - You may remove the sling for showering - Gently pat the area dry.  - Do not soak the shoulder in water.  - Do not go swimming in the pool or ocean until your incision has completely healed (about 4-6 weeks after surgery) - KEEP THE INCISIONS CLEAN AND DRY.  EXERCISES Wear the sling at all times  You may remove the sling for showering, but keep the arm across the chest or in a secondary sling.    Accidental/Purposeful External Rotation and shoulder flexion (reaching behind you) is to be avoided at all costs for the first month. It is ok to come out of your sling if your are sitting and have assistance for eating.   Do not lift anything heavier than 1 pound until we discuss it further in clinic.  It is normal for your fingers/hand to become more swollen after surgery and discolored from bruising.   This will resolve over the first few weeks usually after surgery. Please continue to ambulate and do not stay sitting or lying for too long.  Perform foot and wrist pumps to assist in circulation.  PHYSICAL  THERAPY - You will begin physical therapy soon after surgery (unless otherwise specified) - Please call to set up an appointment, if you do not already have one  - Let our office if there are any issues with scheduling your therapy  - You have a physical therapy appointment scheduled at [facility] (across the hall from our office) on Monday, 11/20 @ 1015am   REGIONAL ANESTHESIA (NERVE BLOCKS) The anesthesia team may have performed a nerve block for you this is a great tool used to minimize pain.   The block may start wearing off overnight (between 8-24 hours postop) When the block wears off, your pain may go from nearly zero to the pain you would have had postop without the block. This is an abrupt transition but nothing dangerous is happening.   This can be a challenging period but utilize your as needed pain medications to try and manage this period. We suggest you use the pain medication the first night prior to going to bed, to ease this transition.  You may take an extra dose of narcotic when this happens if needed   POST-OP MEDICATIONS- Multimodal approach to pain control In general your pain will be controlled with a combination of substances.  Prescriptions unless otherwise discussed are electronically sent to your pharmacy.  This is a carefully made plan we use to minimize narcotic use.  Meloxicam - Anti-inflammatory medication taken on a scheduled basis Acetaminophen - Non-narcotic pain medicine taken on a scheduled basis  Oxycodone - This is a strong narcotic, to be used only on an "as needed" basis for SEVERE pain. Aspirin 81mg  - This medicine is used to minimize the risk of blood clots after surgery. Zofran -  take as needed for nausea   FOLLOW-UP If you develop a Fever (>101.5), Redness or Drainage from the surgical incision site, please call our office to arrange for an evaluation. Please call the office to schedule a follow-up appointment for a wound check, 7-10 days  post-operatively.  IF YOU HAVE ANY QUESTIONS, PLEASE FEEL FREE TO CALL OUR OFFICE.  HELPFUL INFORMATION  Your arm will be in a sling following surgery. You will be in this sling for the next 4 weeks.   You may be more comfortable sleeping in a semi-seated position the first few nights following surgery.  Keep a pillow propped under the elbow and forearm for comfort.  If you have a recliner type of chair it might be beneficial.  If not that is fine too, but it would be helpful to sleep propped up with pillows behind your operated shoulder as well under your elbow and forearm.  This will reduce pulling on the suture lines.  When dressing, put your operative arm in the sleeve first.  When getting undressed, take your operative arm out last.  Loose fitting, button-down shirts are recommended.  In most states it is against the law to drive while your arm is in a sling. And certainly against the law to drive while taking narcotics.  You may return to work/school in the next couple of days when you feel up to it. Desk work and typing in the sling is fine.  We suggest you use the pain medication the first night prior to going to bed, in order to ease any pain when the anesthesia wears off. You should avoid taking pain medications on an empty stomach as it will make you nauseous.  You should wean off your narcotic medicines as soon as you are able.     Most patients will be off or using minimal narcotics before their first postop appointment.   Do not drink alcoholic beverages or take illicit drugs when taking pain medications.  Pain medication may make you constipated.  Below are a few solutions to try in this order: Decrease the amount of pain medication if you aren't having pain. Drink lots of decaffeinated fluids. Drink prune juice and/or each dried prunes  If the first 3 don't work start with additional solutions Take Colace - an over-the-counter stool softener Take Senokot - an  over-the-counter laxative Take Miralax - a stronger over-the-counter laxative   Dental Antibiotics:  In most cases prophylactic antibiotics for Dental procdeures after total joint surgery are not necessary.  Exceptions are as follows:  1. History of prior total joint infection  2. Severely immunocompromised (Organ Transplant, cancer chemotherapy, Rheumatoid biologic meds such as Humera)  3. Poorly controlled diabetes (A1C &gt; 8.0, blood glucose over 200)  If you have one of these conditions, contact your surgeon for an antibiotic prescription, prior to your dental procedure.   For more information including helpful videos and documents visit our website:   [email or web address]     Post Anesthesia Home Care Instructions  Activity: Get plenty of rest for the remainder of the day. A responsible individual must stay with you for 24 hours following the procedure.  For the next 24 hours, DO NOT: -Drive a car -Advertising copywriter -Drink alcoholic beverages -Take any medication unless instructed by your physician -Make any legal decisions or sign important papers.  Meals: Start with liquid foods such as gelatin or soup. Progress to regular foods as tolerated. Avoid greasy, spicy, heavy foods. If nausea and/or vomiting occur, drink only clear liquids until the nausea and/or vomiting subsides. Call your physician if vomiting continues.  Special Instructions/Symptoms: Your throat may feel dry or sore from the anesthesia or the breathing tube placed in your throat during surgery. If this causes discomfort, gargle with warm salt water. The discomfort should disappear within 24 hours.  If you had a scopolamine patch placed behind your ear for the management of post- operative nausea and/or vomiting:  1. The medication in the patch is effective for 72 hours, after which it should be removed.  Wrap patch in a tissue and discard in the trash. Wash hands  thoroughly with soap and water. 2. You may remove the patch earlier than 72 hours if you experience unpleasant side effects which may include dry mouth, dizziness or visual disturbances. 3. Avoid touching the patch. Wash your hands with soap and water after contact with the patch.    Regional Anesthesia Blocks  1. Numbness or the inability to move the "blocked" extremity may last from 3-48 hours after placement. The length of time depends on the medication injected and your individual response to the medication. If the numbness is not going away after 48 hours, call your surgeon.  2. The extremity that is blocked will need to be protected until the numbness is gone and the  Strength has returned. Because you cannot feel it, you will need to take extra care to avoid injury. Because it may be weak, you may have difficulty moving it or using it. You may not know what position it is in without looking at it while the block is in effect.  3. For blocks in the legs and feet, returning to weight bearing and walking needs to be done carefully. You will need to wait until the numbness is entirely gone and the strength has returned. You should be able to move your leg and foot normally before you try and bear weight or walk. You will need someone to be with you when you first try to ensure you do not fall and possibly risk injury.  4. Bruising and tenderness at the needle site are common side effects and will resolve in a few days.  5. Persistent numbness or new problems with movement should be communicated to the surgeon or the [facility] [id number] [facility] [phone]).

## 2022-02-02 NOTE — Anesthesia Procedure Notes (Signed)
Procedure Name: Intubation Date/Time: 02/02/2022 8:46 AM  Performed by: Lavonia Dana, CRNAPre-anesthesia Checklist: Patient identified, Emergency Drugs available, Suction available and Patient being monitored Patient Re-evaluated:Patient Re-evaluated prior to induction Oxygen Delivery Method: Circle system utilized Preoxygenation: Pre-oxygenation with 100% oxygen Induction Type: IV induction Ventilation: Mask ventilation without difficulty and Oral airway inserted - appropriate to patient size Laryngoscope Size: Mac and 4 Grade View: Grade II Tube type: Oral Tube size: 7.5 mm Number of attempts: 1 Airway Equipment and Method: Stylet, Oral airway and Bite block Placement Confirmation: ETT inserted through vocal cords under direct vision, positive ETCO2 and breath sounds checked- equal and bilateral Secured at: 24 cm Tube secured with: Tape Dental Injury: Teeth and Oropharynx as per pre-operative assessment

## 2022-02-06 DIAGNOSIS — M19012 Primary osteoarthritis, left shoulder: Secondary | ICD-10-CM | POA: Diagnosis not present

## 2022-02-06 DIAGNOSIS — M6281 Muscle weakness (generalized): Secondary | ICD-10-CM | POA: Diagnosis not present

## 2022-02-06 DIAGNOSIS — M25612 Stiffness of left shoulder, not elsewhere classified: Secondary | ICD-10-CM | POA: Diagnosis not present

## 2022-02-07 ENCOUNTER — Encounter (HOSPITAL_BASED_OUTPATIENT_CLINIC_OR_DEPARTMENT_OTHER): Payer: Self-pay | Admitting: Orthopaedic Surgery

## 2022-02-13 DIAGNOSIS — M25612 Stiffness of left shoulder, not elsewhere classified: Secondary | ICD-10-CM | POA: Diagnosis not present

## 2022-02-13 DIAGNOSIS — M6281 Muscle weakness (generalized): Secondary | ICD-10-CM | POA: Diagnosis not present

## 2022-02-13 DIAGNOSIS — M19012 Primary osteoarthritis, left shoulder: Secondary | ICD-10-CM | POA: Diagnosis not present

## 2022-02-14 DIAGNOSIS — M19012 Primary osteoarthritis, left shoulder: Secondary | ICD-10-CM | POA: Diagnosis not present

## 2022-02-20 DIAGNOSIS — M19012 Primary osteoarthritis, left shoulder: Secondary | ICD-10-CM | POA: Diagnosis not present

## 2022-02-20 DIAGNOSIS — M25612 Stiffness of left shoulder, not elsewhere classified: Secondary | ICD-10-CM | POA: Diagnosis not present

## 2022-02-20 DIAGNOSIS — M6281 Muscle weakness (generalized): Secondary | ICD-10-CM | POA: Diagnosis not present

## 2022-02-24 DIAGNOSIS — X32XXXD Exposure to sunlight, subsequent encounter: Secondary | ICD-10-CM | POA: Diagnosis not present

## 2022-02-24 DIAGNOSIS — L570 Actinic keratosis: Secondary | ICD-10-CM | POA: Diagnosis not present

## 2022-02-24 DIAGNOSIS — D225 Melanocytic nevi of trunk: Secondary | ICD-10-CM | POA: Diagnosis not present

## 2022-02-24 DIAGNOSIS — Z1283 Encounter for screening for malignant neoplasm of skin: Secondary | ICD-10-CM | POA: Diagnosis not present

## 2022-02-24 DIAGNOSIS — B078 Other viral warts: Secondary | ICD-10-CM | POA: Diagnosis not present

## 2022-02-24 DIAGNOSIS — Z85828 Personal history of other malignant neoplasm of skin: Secondary | ICD-10-CM | POA: Diagnosis not present

## 2022-02-24 DIAGNOSIS — Z08 Encounter for follow-up examination after completed treatment for malignant neoplasm: Secondary | ICD-10-CM | POA: Diagnosis not present

## 2022-02-28 DIAGNOSIS — M19012 Primary osteoarthritis, left shoulder: Secondary | ICD-10-CM | POA: Diagnosis not present

## 2022-02-28 DIAGNOSIS — M6281 Muscle weakness (generalized): Secondary | ICD-10-CM | POA: Diagnosis not present

## 2022-02-28 DIAGNOSIS — M25612 Stiffness of left shoulder, not elsewhere classified: Secondary | ICD-10-CM | POA: Diagnosis not present

## 2022-03-03 DIAGNOSIS — I10 Essential (primary) hypertension: Secondary | ICD-10-CM | POA: Diagnosis not present

## 2022-03-03 DIAGNOSIS — K219 Gastro-esophageal reflux disease without esophagitis: Secondary | ICD-10-CM | POA: Diagnosis not present

## 2022-03-03 DIAGNOSIS — Z86010 Personal history of colonic polyps: Secondary | ICD-10-CM | POA: Diagnosis not present

## 2022-03-03 DIAGNOSIS — E781 Pure hyperglyceridemia: Secondary | ICD-10-CM | POA: Diagnosis not present

## 2022-03-03 DIAGNOSIS — E669 Obesity, unspecified: Secondary | ICD-10-CM | POA: Diagnosis not present

## 2022-03-03 DIAGNOSIS — M1711 Unilateral primary osteoarthritis, right knee: Secondary | ICD-10-CM | POA: Diagnosis not present

## 2022-03-03 DIAGNOSIS — E1169 Type 2 diabetes mellitus with other specified complication: Secondary | ICD-10-CM | POA: Diagnosis not present

## 2022-03-03 DIAGNOSIS — M109 Gout, unspecified: Secondary | ICD-10-CM | POA: Diagnosis not present

## 2022-03-03 DIAGNOSIS — R319 Hematuria, unspecified: Secondary | ICD-10-CM | POA: Diagnosis not present

## 2022-03-03 DIAGNOSIS — I700 Atherosclerosis of aorta: Secondary | ICD-10-CM | POA: Diagnosis not present

## 2022-03-06 DIAGNOSIS — M19012 Primary osteoarthritis, left shoulder: Secondary | ICD-10-CM | POA: Diagnosis not present

## 2022-03-06 DIAGNOSIS — M6281 Muscle weakness (generalized): Secondary | ICD-10-CM | POA: Diagnosis not present

## 2022-03-06 DIAGNOSIS — M25612 Stiffness of left shoulder, not elsewhere classified: Secondary | ICD-10-CM | POA: Diagnosis not present

## 2022-03-07 DIAGNOSIS — M19012 Primary osteoarthritis, left shoulder: Secondary | ICD-10-CM | POA: Diagnosis not present

## 2022-03-23 DIAGNOSIS — M25612 Stiffness of left shoulder, not elsewhere classified: Secondary | ICD-10-CM | POA: Diagnosis not present

## 2022-03-23 DIAGNOSIS — M6281 Muscle weakness (generalized): Secondary | ICD-10-CM | POA: Diagnosis not present

## 2022-03-23 DIAGNOSIS — M19012 Primary osteoarthritis, left shoulder: Secondary | ICD-10-CM | POA: Diagnosis not present

## 2022-03-30 DIAGNOSIS — M19012 Primary osteoarthritis, left shoulder: Secondary | ICD-10-CM | POA: Diagnosis not present

## 2022-03-30 DIAGNOSIS — M25612 Stiffness of left shoulder, not elsewhere classified: Secondary | ICD-10-CM | POA: Diagnosis not present

## 2022-03-30 DIAGNOSIS — M6281 Muscle weakness (generalized): Secondary | ICD-10-CM | POA: Diagnosis not present

## 2022-04-06 DIAGNOSIS — M25612 Stiffness of left shoulder, not elsewhere classified: Secondary | ICD-10-CM | POA: Diagnosis not present

## 2022-04-06 DIAGNOSIS — M19012 Primary osteoarthritis, left shoulder: Secondary | ICD-10-CM | POA: Diagnosis not present

## 2022-04-06 DIAGNOSIS — M6281 Muscle weakness (generalized): Secondary | ICD-10-CM | POA: Diagnosis not present

## 2022-04-28 DIAGNOSIS — I10 Essential (primary) hypertension: Secondary | ICD-10-CM | POA: Diagnosis not present

## 2022-04-28 DIAGNOSIS — R002 Palpitations: Secondary | ICD-10-CM | POA: Diagnosis not present

## 2022-05-04 ENCOUNTER — Ambulatory Visit: Payer: Medicare PPO | Attending: Internal Medicine | Admitting: Internal Medicine

## 2022-05-04 ENCOUNTER — Ambulatory Visit: Payer: Medicare PPO | Attending: Internal Medicine

## 2022-05-04 ENCOUNTER — Other Ambulatory Visit: Payer: Self-pay | Admitting: Internal Medicine

## 2022-05-04 ENCOUNTER — Encounter: Payer: Self-pay | Admitting: Internal Medicine

## 2022-05-04 VITALS — BP 130/70 | HR 55 | Ht 71.0 in | Wt 242.2 lb

## 2022-05-04 DIAGNOSIS — E785 Hyperlipidemia, unspecified: Secondary | ICD-10-CM

## 2022-05-04 DIAGNOSIS — R072 Precordial pain: Secondary | ICD-10-CM

## 2022-05-04 DIAGNOSIS — I2584 Coronary atherosclerosis due to calcified coronary lesion: Secondary | ICD-10-CM | POA: Diagnosis not present

## 2022-05-04 DIAGNOSIS — I700 Atherosclerosis of aorta: Secondary | ICD-10-CM | POA: Diagnosis not present

## 2022-05-04 DIAGNOSIS — I2510 Atherosclerotic heart disease of native coronary artery without angina pectoris: Secondary | ICD-10-CM

## 2022-05-04 DIAGNOSIS — I10 Essential (primary) hypertension: Secondary | ICD-10-CM

## 2022-05-04 DIAGNOSIS — R002 Palpitations: Secondary | ICD-10-CM

## 2022-05-04 MED ORDER — NITROGLYCERIN 0.4 MG SL SUBL: 0.4000 mg | SUBLINGUAL_TABLET | SUBLINGUAL | 3 refills | Status: AC | PRN

## 2022-05-04 NOTE — Patient Instructions (Signed)
Medication Instructions:  Your physician has recommended you make the following change in your medication:  START: nitroglycerin 0.4 mg under your tongue as needed for chest pain  If you have chest pain place 1 tablet under your tongue, wait 5 min If chest pain continues place a 2nd tablet under your tongue, wait 5 min If chest pain continues place a 3rd tablet under your tongue, wait 5 min If chest pain continues call 911  *If you need a refill on your cardiac medications before your next appointment, please call your pharmacy*   Lab Work: TODAY: BMP  If you have labs (blood work) drawn today and your tests are completely normal, you will receive your results only by: [place] (if you have MyChart) OR A paper copy in the mail If you have any lab test that is abnormal or we need to change your treatment, we will call you to review the results.   Testing/Procedures: Your physician has requested that you have cardiac CT. Cardiac computed tomography (CT) is a painless test that uses an x-ray machine to take clear, detailed pictures of your heart. For further information please visit [email or web address]. Please follow instruction sheet as given.   Your physician has requested that you wear a heart monitor.    Follow-Up: At [facility], you and your health needs are our priority.  As part of our continuing mission to provide you with exceptional heart care, we have created designated Provider Care Teams.  These Care Teams include your primary Cardiologist (physician) and Advanced Practice Providers (APPs -  Physician Assistants and Nurse Practitioners) who all work together to provide you with the care you need, when you need it.  We recommend signing up for the patient portal called "MyChart".  Sign up information is provided on this After Visit Summary.  MyChart is used to connect with patients for Virtual Visits (Telemedicine).  Patients are able to view lab/test results,  encounter notes, upcoming appointments, etc.  Non-urgent messages can be sent to your provider as well.   To learn more about what you can do with MyChart, go to [email or web address].    Your next appointment:   3 month(s)  Provider:   Rudean Haskell, MD   Other Instructions   Your cardiac CT will be scheduled at one of the below locations:   [facility] [address] [place], [place] [address] [phone]  [place] [address] [place], [place] [address] [phone]  [facility] [place],  [address] [phone]  If scheduled at [facility], please arrive at the [facility] and Children's Entrance (Entrance C2) of [facility] 30 minutes prior to test start time. You can use the FREE valet parking offered at entrance C (encouraged to control the heart rate for the test)  Proceed to the [facility] Radiology Department (first floor) to check-in and test prep.  All radiology patients and guests should use entrance C2 at [facility], accessed from [facility], even though the hospital's physical address listed is [address].    If scheduled at [facility] or [facility], please arrive 15 mins early for check-in and test prep.   Please follow these instructions carefully (unless otherwise directed):  Hold all erectile dysfunction medications at least 3 days (72 hrs) prior to test. (Ie viagra, cialis, sildenafil, tadalafil, etc) We  will administer nitroglycerin during this exam.   On the Night Before the Test: Be sure to Drink plenty of water. Do not consume any caffeinated/decaffeinated beverages or chocolate 12 hours prior to your test. Do not take any antihistamines 12 hours prior to your test.   On the Day of the Test: Drink plenty of water until  1 hour prior to the test. Do not eat any food 1 hour prior to test. You may take your regular medications prior to the test.  If you take Furosemide/Hydrochlorothiazide/Spironolactone, please HOLD on the morning of the test.          After the Test: Drink plenty of water. After receiving IV contrast, you may experience a mild flushed feeling. This is normal. On occasion, you may experience a mild rash up to 24 hours after the test. This is not dangerous. If this occurs, you can take Benadryl 25 mg and increase your fluid intake. If you experience trouble breathing, this can be serious. If it is severe call 911 IMMEDIATELY. If it is mild, please call our office. If you take any of these medications: Glipizide/Metformin, Avandament, Glucavance, please do not take 48 hours after completing test unless otherwise instructed.  We will call to schedule your test 2-4 weeks out understanding that some insurance companies will need an authorization prior to the service being performed.   For non-scheduling related questions, please contact the cardiac imaging nurse navigator should you have any questions/concerns: Marchia Bond, Cardiac Imaging Nurse Navigator Gordy Clement, Cardiac Imaging Nurse Navigator [facility] Direct Office Dial: [phone]   For scheduling needs, including cancellations and rescheduling, please call [place], [phone].    ZIO XT- Long Term Monitor Instructions  Your physician has requested you wear a ZIO patch monitor for 7 days.  This is a single patch monitor. [employer] supplies one patch monitor per enrollment. Additional stickers are not available. Please do not apply patch if you will be having a Nuclear Stress Test,  Cardiac CT, MRI, or Chest Xray during the period you would be wearing the  monitor. The patch cannot be worn during these tests. You cannot remove and re-apply the  ZIO XT patch monitor.  Your ZIO patch monitor will be  mailed 3 day USPS to your address on file. It may take 3-5 days  to receive your monitor after you have been enrolled.  Once you have received your monitor, please review the enclosed instructions. Your monitor  has already been registered assigning a specific monitor serial # to you.  Billing and Patient Assistance Program Information  We have supplied [employer] with any of your insurance information on file for billing purposes. [employer] offers a sliding scale Patient Assistance Program for patients that do not have  insurance, or whose insurance does not completely cover the cost of the ZIO monitor.  You must apply for the Patient Assistance Program to qualify for this discounted rate.  To apply, please call [employer] at [phone], select option 4, select option 2, ask to apply for  Patient Assistance Program. Theodore Demark will ask your household income, and how many people  are in your household. They will quote your out-of-pocket cost based on that information.  [employer] will also be able to set up a 49-month interest-free payment plan if needed.  Applying the monitor   Shave hair from upper left chest.  Hold abrader disc by orange tab. Rub abrader in 40 strokes over the upper left chest as  indicated  in your monitor instructions.  Clean area with 4 enclosed alcohol pads. Let dry.  Apply patch as indicated in monitor instructions. Patch will be placed under collarbone on left  side of chest with arrow pointing upward.  Rub patch adhesive wings for 2 minutes. Remove white label marked "1". Remove the white  label marked "2". Rub patch adhesive wings for 2 additional minutes.  While looking in a mirror, press and release button in center of patch. A small green light will  flash 3-4 times. This will be your only indicator that the monitor has been turned on.  Do not shower for the first 24 hours. You may shower after the first 24 hours.  Press the button if you feel a symptom. You will hear  a small click. Record Date, Time and  Symptom in the Patient Logbook.  When you are ready to remove the patch, follow instructions on the last 2 pages of Patient  Logbook. Stick patch monitor onto the last page of Patient Logbook.  Place Patient Logbook in the blue and white box. Use locking tab on box and tape box closed  securely. The blue and white box has prepaid postage on it. Please place it in the mailbox as  soon as possible. Your physician should have your test results approximately 7 days after the  monitor has been mailed back to [facility].  Call [place] at [phone] if you have questions regarding  your ZIO XT patch monitor. Call them immediately if you see an orange light blinking on your  monitor.  If your monitor falls off in less than 4 days, contact our Monitor department at [phone].  If your monitor becomes loose or falls off after 4 days call [employer] at [phone] for  suggestions on securing your monitor

## 2022-05-04 NOTE — Progress Notes (Unsigned)
Enrolled for [employer] to mail a ZIO XT long term holter monitor to the patients address on file.  

## 2022-05-04 NOTE — Progress Notes (Signed)
Cardiology Office Note:    Date:  05/04/2022   ID:  Theodore, Ballard [birth year], MRN [id number]  PCP:  Kathalene Frames, MD   [facility] Cardiologist:  Pixie Casino, MD     Referring MD: Kathalene Frames, *   CC: Transition to new cardiologist  History of Present Illness:    Theodore Ballard is a 67 y.o. adult with a hx of CAC, and aortic atherosclerosis.  Seeing lipid clinic for prevention disease related to statin myalgia. Has issues with gout flare on fenofibrate; did not feel comfortable trying vascepa.  Myalgias with atorvastatin and crestor.  Patient notes that he is doing poorly.   Since last visit notes that he has had sudden onset discomfort. It is not truly pain, persay, but he feels exertionally worse.  He distantly had a LHC from L fem and new he had blockages when seeing Dr. Tamala Julian. No SOB.  Has palpitations that sometime occur with this.  His BP cuff had shown BP 30 mm Hg higher than at office visits.  This is seen with our visit today when he brough his cuff in.  No syncope.  He has dropped 40 lbs with changes to sugars and carbohydrates.  Prior to these symptoms he was walking more.  His LDL and Tgs have dramatically improved.   Past Medical History:  Diagnosis Date   Anxiety    Arthritis    Environmental allergies    GERD (gastroesophageal reflux disease)    Gout    Hyperlipidemia    Hypertension     Past Surgical History:  Procedure Laterality Date   COLONOSCOPY     HEEL SPUR SURGERY Left    REVERSE SHOULDER ARTHROPLASTY Left 02/02/2022   Procedure: REVERSE SHOULDER ARTHROPLASTY;  Surgeon: Hiram Gash, MD;  Location: [facility];  Service: Orthopedics;  Laterality: Left;    Current Medications: Current Meds  Medication Sig   allopurinol (ZYLOPRIM) 300 MG tablet Take 1 tablet (300 mg total) by mouth daily.   ASPIRIN 81 PO daily at 6 (six) AM.   augmented betamethasone dipropionate  (DIPROLENE-AF) 0.05 % cream Apply topically as needed (rash).   carvedilol (COREG) 12.5 MG tablet Take by mouth.   Cholecalciferol (VITAMIN D3 PO) daily.   Cyanocobalamin (VITAMIN B 12 PO) daily at 6 (six) AM.   diclofenac Sodium (VOLTAREN) 1 % GEL Apply topically.   diphenoxylate-atropine (LOMOTIL) 2.5-0.025 MG tablet Take 1-2 tablets by mouth 4 (four) times daily as needed for diarrhea or loose stools.   gabapentin (NEURONTIN) 100 MG capsule daily at 6 (six) AM.   lisinopril (ZESTRIL) 20 MG tablet Take 20 mg by mouth daily.   loratadine (CLARITIN) 10 MG tablet as needed for allergies.   methylPREDNISolone (MEDROL DOSEPAK) 4 MG TBPK tablet as needed (gout).   Multiple Vitamin (MULTI VITAMIN MENS PO) Take by mouth daily at 6 (six) AM.   nitroGLYCERIN (NITROSTAT) 0.4 MG SL tablet Place 1 tablet (0.4 mg total) under the tongue every 5 (five) minutes as needed for chest pain.   olopatadine (PATANOL) 0.1 % ophthalmic solution Place 1 drop into both eyes 2 (two) times daily.   pantoprazole (PROTONIX) 40 MG tablet TAKE 1 TABLET BY MOUTH DAILY AS NEEDED   pravastatin (PRAVACHOL) 80 MG tablet Take 1 tablet (80 mg total) by mouth every evening. (Patient taking differently: Take 40 mg by mouth every evening.)   SYMBICORT 80-4.5 MCG/ACT inhaler Inhale into the lungs.  Allergies:   Bactrim [sulfamethoxazole-trimethoprim], Atorvastatin, Crestor [rosuvastatin], Fenofibrate, Icosapent ethyl, Metformin, and Penicillins   Social History   Socioeconomic History   Marital status: Married    Spouse name: Not on file   Number of children: Not on file   Years of education: Not on file   Highest education level: Not on file  Occupational History   Not on file  Tobacco Use   Smoking status: Never   Smokeless tobacco: Never  Substance and Sexual Activity   Alcohol use: Yes    Comment: occ beer   Drug use: No   Sexual activity: Yes  Other Topics Concern   Not on file  Social History Narrative   Not  on file   Social Determinants of Health   Financial Resource Strain: Not on file  Food Insecurity: Not on file  Transportation Needs: Not on file  Physical Activity: Not on file  Stress: Not on file  Social Connections: Not on file     Family History: The patient's family history includes Cancer in [place] mother and sister; Heart disease in [place] father; Hypertension in [place] father.  ROS:   Please see the history of present illness.     All other systems reviewed and are negative.  EKGs/Labs/Other Studies Reviewed:    The following studies were reviewed today:      Recent Labs: No results found for requested labs within last 365 days.  Recent Lipid Panel    Component Value Date/Time   CHOL 179 06/25/2020 0930   TRIG 890 (H) 06/25/2020 0930   HDL 39 (L) 06/25/2020 0930   CHOLHDL 4.6 06/25/2020 0930   LDLCALC  06/25/2020 0930     Comment:     . LDL cholesterol not calculated. Triglyceride levels greater than 400 mg/dL invalidate calculated LDL results. . Reference range: <100 . Desirable range <100 mg/dL for primary prevention;   <70 mg/dL for patients with CHD or diabetic patients  with > or = 2 CHD risk factors. Marland Kitchen LDL-C is now calculated using the Martin-Hopkins  calculation, which is a validated novel method providing  better accuracy than the Friedewald equation in the  estimation of LDL-C.  Cresenciano Genre et al. Annamaria Helling. [id number]): 2061-2068  (http://education.Quest[employer].com/faq/FAQ164)         Physical Exam:    VS:  BP 130/70   Pulse (!) 55   Ht 5' 11"$  (1.803 m)   Wt 242 lb 3.2 oz (109.9 kg)   SpO2 99%   BMI 33.78 kg/m     Wt Readings from Last 3 Encounters:  05/04/22 242 lb 3.2 oz (109.9 kg)  02/02/22 245 lb 9.5 oz (111.4 kg)  08/01/21 265 lb 6.4 oz (120.4 kg)    GEN:  Well nourished, well developed in no acute distress HEENT: Frank's sIgn NECK: No JVD CARDIAC: RRR, no murmurs, rubs,  gallops RESPIRATORY:  Clear to auscultation without rales, wheezing or rhonchi  ABDOMEN: Soft, non-tender, non-distended MUSCULOSKELETAL:  No edema; No deformity  SKIN: Warm and dry NEUROLOGIC:  Alert and oriented x 3 PSYCHIATRIC:  Normal affect   ASSESSMENT:    1. Precordial pain   2. Coronary artery calcification   3. Aortic atherosclerosis ([place])   4. Primary hypertension   5. Hyperlipidemia LDL goal <70    PLAN:    Precordial pain equivalent CAC in pLAD with aortic atherosclerosis Mixed HLD - reviewed 2022 CT - concerned about angina - will get CCTA;  will get PRN Nitro - at next visit lipids and ALT to discuss medical therapy options  Daily Palpitations - will get one week non live ziopatch  HTN - discussed new cuffs using validate http://www.holt.com/ - if labile BP by 05/18/22 send to Pharm D  Otherwise 3 months with me Confirmed he is a Cis-Gender Male  Time Spent Directly with Patient:   I have spent a total of 40 minues with the patient reviewing notes, imaging, EKGs, labs and examining the patient as well as establishing an assessment and plan that was discussed personally with the patient.  > 50% of time was spent in direct patient care and family and reviewing imaging with patient .       Medication Adjustments/Labs and Tests Ordered: Current medicines are reviewed at length with the patient today.  Concerns regarding medicines are outlined above.  Orders Placed This Encounter  Procedures   CT CORONARY MORPH W/CTA COR W/SCORE W/CA W/CM &/OR WO/CM   Basic metabolic panel   LONG TERM MONITOR (3-14 DAYS)   Meds ordered this encounter  Medications   nitroGLYCERIN (NITROSTAT) 0.4 MG SL tablet    Sig: Place 1 tablet (0.4 mg total) under the tongue every 5 (five) minutes as needed for chest pain.    Dispense:  90 tablet    Refill:  3    Patient Instructions  Medication Instructions:  Your physician has recommended you make the following change in your medication:   START: nitroglycerin 0.4 mg under your tongue as needed for chest pain  If you have chest pain place 1 tablet under your tongue, wait 5 min If chest pain continues place a 2nd tablet under your tongue, wait 5 min If chest pain continues place a 3rd tablet under your tongue, wait 5 min If chest pain continues call 911  *If you need a refill on your cardiac medications before your next appointment, please call your pharmacy*   Lab Work: TODAY: BMP  If you have labs (blood work) drawn today and your tests are completely normal, you will receive your results only by: [place] (if you have MyChart) OR A paper copy in the mail If you have any lab test that is abnormal or we need to change your treatment, we will call you to review the results.   Testing/Procedures: Your physician has requested that you have cardiac CT. Cardiac computed tomography (CT) is a painless test that uses an x-ray machine to take clear, detailed pictures of your heart. For further information please visit [email or web address]. Please follow instruction sheet as given.   Your physician has requested that you wear a heart monitor.    Follow-Up: At [facility], you and your health needs are our priority.  As part of our continuing mission to provide you with exceptional heart care, we have created designated Provider Care Teams.  These Care Teams include your primary Cardiologist (physician) and Advanced Practice Providers (APPs -  Physician Assistants and Nurse Practitioners) who all work together to provide you with the care you need, when you need it.  We recommend signing up for the patient portal called "MyChart".  Sign up information is provided on this After Visit Summary.  MyChart is used to connect with patients for Virtual Visits (Telemedicine).  Patients are able to view lab/test results, encounter notes, upcoming appointments, etc.  Non-urgent messages can be sent to your provider as well.    To learn more about what you can do with MyChart, go  to [email or web address].    Your next appointment:   3 month(s)  Provider:   Rudean Haskell, MD   Other Instructions   Your cardiac CT will be scheduled at one of the below locations:   [facility] [address] [place], [place] [address] [phone]  [facility] [address] [place], [place] [address] [phone]  [facility] [place], [place] [address] [phone]  If scheduled at [facility], please arrive at the [facility] and Children's Entrance (Entrance C2) of [facility] 30 minutes prior to test start time. You can use the FREE valet parking offered at entrance C (encouraged to control the heart rate for the test)  Proceed to the [facility] Radiology Department (first floor) to check-in and test prep.  All radiology patients and guests should use entrance C2 at [facility], accessed from [facility], even though the hospital's physical address listed is [address].    If scheduled at [facility] or [facility], please arrive 15 mins early for check-in and test prep.   Please follow these instructions carefully (unless otherwise directed):  Hold all erectile dysfunction medications at least 3 days (72 hrs) prior to test. (Ie viagra, cialis, sildenafil, tadalafil, etc) We will administer nitroglycerin during this exam.   On the Night Before the Test: Be sure to Drink plenty of water. Do not consume any caffeinated/decaffeinated beverages or chocolate 12 hours prior to your test. Do not take any antihistamines 12 hours prior to your test.   On the Day of the Test: Drink plenty of water until 1 hour prior to the test. Do not eat any food 1 hour prior to test. You may take your regular  medications prior to the test.  If you take Furosemide/Hydrochlorothiazide/Spironolactone, please HOLD on the morning of the test.          After the Test: Drink plenty of water. After receiving IV contrast, you may experience a mild flushed feeling. This is normal. On occasion, you may experience a mild rash up to 24 hours after the test. This is not dangerous. If this occurs, you can take Benadryl 25 mg and increase your fluid intake. If you experience trouble breathing, this can be serious. If it is severe call 911 IMMEDIATELY. If it is mild, please call our office. If you take any of these medications: Glipizide/Metformin, Avandament, Glucavance, please do not take 48 hours after completing test unless otherwise instructed.  We will call to schedule your test 2-4 weeks out understanding that some insurance companies will need an authorization prior to the service being performed.   For non-scheduling related questions, please contact the cardiac imaging nurse navigator should you have any questions/concerns: Marchia Bond, Cardiac Imaging Nurse Navigator Gordy Clement, Cardiac Imaging Nurse Navigator [facility] Direct Office Dial: [phone]   For scheduling needs, including cancellations and rescheduling, please call [place], [phone].    ZIO XT- Long Term Monitor Instructions  Your physician has requested you wear a ZIO patch monitor for 7 days.  This is a single patch monitor. [employer] supplies one patch monitor per enrollment. Additional stickers are not available. Please do not apply patch if you will be having a Nuclear Stress Test,  Cardiac CT, MRI, or Chest Xray during the period you would be wearing the  monitor. The patch cannot  be worn during these tests. You cannot remove and re-apply the  ZIO XT patch monitor.  Your ZIO patch monitor will be mailed 3 day USPS to your address on file. It may take 3-5 days  to receive your monitor after  you have been enrolled.  Once you have received your monitor, please review the enclosed instructions. Your monitor  has already been registered assigning a specific monitor serial # to you.  Billing and Patient Assistance Program Information  We have supplied [employer] with any of your insurance information on file for billing purposes. [employer] offers a sliding scale Patient Assistance Program for patients that do not have  insurance, or whose insurance does not completely cover the cost of the ZIO monitor.  You must apply for the Patient Assistance Program to qualify for this discounted rate.  To apply, please call [employer] at [phone], select option 4, select option 2, ask to apply for  Patient Assistance Program. Theodore Demark will ask your household income, and how many people  are in your household. They will quote your out-of-pocket cost based on that information.  [employer] will also be able to set up a 3-month interest-free payment plan if needed.  Applying the monitor   Shave hair from upper left chest.  Hold abrader disc by orange tab. Rub abrader in 40 strokes over the upper left chest as  indicated in your monitor instructions.  Clean area with 4 enclosed alcohol pads. Let dry.  Apply patch as indicated in monitor instructions. Patch will be placed under collarbone on left  side of chest with arrow pointing upward.  Rub patch adhesive wings for 2 minutes. Remove white label marked "1". Remove the white  label marked "2". Rub patch adhesive wings for 2 additional minutes.  While looking in a mirror, press and release button in center of patch. A small green light will  flash 3-4 times. This will be your only indicator that the monitor has been turned on.  Do not shower for the first 24 hours. You may shower after the first 24 hours.  Press the button if you feel a symptom. You will hear a small click. Record Date, Time and  Symptom in the Patient Logbook.  When you are ready to  remove the patch, follow instructions on the last 2 pages of Patient  Logbook. Stick patch monitor onto the last page of Patient Logbook.  Place Patient Logbook in the blue and white box. Use locking tab on box and tape box closed  securely. The blue and white box has prepaid postage on it. Please place it in the mailbox as  soon as possible. Your physician should have your test results approximately 7 days after the  monitor has been [facility]  Call [employer] [phone] you have questions regarding  your ZIO XT patch monitor. Call them immediately if you see an orange light blinking on your  monitor.  If your monitor falls off in less than 4 days, contact our Monitor department at [phone]  If your monitor becomes loose or falls off after 4 days call [employer] at [phone]  suggestions on securing your monitor    Signed, MWerner Lean MD  05/04/2022 11:43 AM    [place]

## 2022-05-05 LAB — BASIC METABOLIC PANEL
BUN/Creatinine Ratio: 22 (ref 10–24)
BUN: 15 mg/dL (ref 8–27)
CO2: 24 mmol/L (ref 20–29)
Calcium: 9.5 mg/dL (ref 8.6–10.2)
Chloride: 102 mmol/L (ref 96–106)
Creatinine, Ser: 0.69 mg/dL — ABNORMAL LOW (ref 0.76–1.27)
Glucose: 105 mg/dL — ABNORMAL HIGH (ref 70–99)
Potassium: 4.7 mmol/L (ref 3.5–5.2)
Sodium: 143 mmol/L (ref 134–144)
eGFR: 101 mL/min/{1.73_m2} (ref >59)

## 2022-05-09 DIAGNOSIS — R002 Palpitations: Secondary | ICD-10-CM

## 2022-05-09 DIAGNOSIS — R072 Precordial pain: Secondary | ICD-10-CM

## 2022-05-12 ENCOUNTER — Ambulatory Visit (HOSPITAL_COMMUNITY): Payer: Medicare PPO

## 2022-05-16 DIAGNOSIS — M19012 Primary osteoarthritis, left shoulder: Secondary | ICD-10-CM | POA: Diagnosis not present

## 2022-05-19 DIAGNOSIS — E1136 Type 2 diabetes mellitus with diabetic cataract: Secondary | ICD-10-CM | POA: Diagnosis not present

## 2022-05-19 DIAGNOSIS — M109 Gout, unspecified: Secondary | ICD-10-CM | POA: Diagnosis not present

## 2022-05-19 DIAGNOSIS — R002 Palpitations: Secondary | ICD-10-CM | POA: Diagnosis not present

## 2022-05-19 DIAGNOSIS — I700 Atherosclerosis of aorta: Secondary | ICD-10-CM | POA: Diagnosis not present

## 2022-05-19 DIAGNOSIS — E1142 Type 2 diabetes mellitus with diabetic polyneuropathy: Secondary | ICD-10-CM | POA: Diagnosis not present

## 2022-05-19 DIAGNOSIS — I10 Essential (primary) hypertension: Secondary | ICD-10-CM | POA: Diagnosis not present

## 2022-05-23 DIAGNOSIS — R072 Precordial pain: Secondary | ICD-10-CM | POA: Diagnosis not present

## 2022-05-23 DIAGNOSIS — R002 Palpitations: Secondary | ICD-10-CM | POA: Diagnosis not present

## 2022-05-24 ENCOUNTER — Telehealth (HOSPITAL_COMMUNITY): Payer: Self-pay | Admitting: *Deleted

## 2022-05-24 NOTE — Telephone Encounter (Signed)
Reaching out to patient to offer assistance regarding upcoming cardiac imaging study; pt verbalizes understanding of appt date/time, parking situation and where to check in, pre-test NPO status, and verified current allergies; name and call back number provided for further questions should they arise  Dessirae Scarola RN Navigator Cardiac Imaging [facility] [phone] office [phone] cell  Patient to take his daily medications two hours prior to his cardiac CT scan.  He is aware to arrive at 9am. 

## 2022-05-26 ENCOUNTER — Ambulatory Visit (HOSPITAL_COMMUNITY)
Admission: RE | Admit: 2022-05-26 | Discharge: 2022-05-26 | Disposition: A | Discharge status: Home / Self Care | Payer: Medicare PPO | Source: Ambulatory Visit | Attending: Internal Medicine | Admitting: Internal Medicine

## 2022-05-26 DIAGNOSIS — R072 Precordial pain: Secondary | ICD-10-CM | POA: Diagnosis not present

## 2022-05-26 MED ORDER — NITROGLYCERIN 0.4 MG SL SUBL
0.8000 mg | SUBLINGUAL_TABLET | Freq: Once | SUBLINGUAL | Status: AC
  Administered 2022-05-26: 0.8 mg via SUBLINGUAL

## 2022-05-26 MED ORDER — NITROGLYCERIN 0.4 MG SL SUBL
SUBLINGUAL_TABLET | SUBLINGUAL | Status: AC
  Filled 2022-05-26: qty 2

## 2022-05-26 MED ORDER — IOHEXOL 350 MG/ML SOLN
100.0000 mL | Freq: Once | INTRAVENOUS | Status: AC | PRN
  Administered 2022-05-26: 100 mL via INTRAVENOUS

## 2022-07-24 NOTE — Progress Notes (Unsigned)
Cardiology Office Note:    Date:  07/25/2022   ID:  Theodore, Ballard 03-Oct-1955, MRN [id number]  PCP:  Emilio Aspen, MD   [facility] Providers Cardiologist:  Chrystie Nose, MD     Referring MD: Emilio Aspen, *   CC: CT f/u  History of Present Illness:    Theodore Ballard is a 67 y.o. adult with a hx of CAC, and aortic atherosclerosis.  Seeing lipid clinic for prevention disease related to statin myalgia. Has issues with gout flare on fenofibrate; did not feel comfortable trying vascepa.  Myalgias with atorvastatin and crestor. 2024: had PACs asymptomatic SVT and mild non obstructive CAD  Patient notes that he is doing Ok.   Feels pretty well.  Start to get a little woozy on medical management There are no interval hospital/ED visit.   Rare palpitations.  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   He is taking the pravastatin with minimal leg pain; leg pain may be non medication based.  Ambulatory blood pressure 140/80.    Past Medical History:  Diagnosis Date   Anxiety    Arthritis    Environmental allergies    GERD (gastroesophageal reflux disease)    Gout    Hyperlipidemia    Hypertension     Past Surgical History:  Procedure Laterality Date   COLONOSCOPY     HEEL SPUR SURGERY Left    REVERSE SHOULDER ARTHROPLASTY Left 02/02/2022   Procedure: REVERSE SHOULDER ARTHROPLASTY;  Surgeon: Bjorn Pippin, MD;  Location: [facility];  Service: Orthopedics;  Laterality: Left;    Current Medications: Current Meds  Medication Sig   allopurinol (ZYLOPRIM) 300 MG tablet Take 1 tablet (300 mg total) by mouth daily.   ASPIRIN 81 PO daily at 6 (six) AM.   augmented betamethasone dipropionate (DIPROLENE-AF) 0.05 % cream Apply topically as needed (rash).   carvedilol (COREG) 25 MG tablet Take 25 mg by mouth 2 (two) times daily with a meal.   Cholecalciferol (VITAMIN D3 PO) daily.    Cyanocobalamin (VITAMIN B 12 PO) daily at 6 (six) AM.   diclofenac Sodium (VOLTAREN) 1 % GEL Apply topically as needed (pain).   diphenoxylate-atropine (LOMOTIL) 2.5-0.025 MG tablet Take 1-2 tablets by mouth 4 (four) times daily as needed for diarrhea or loose stools.   gabapentin (NEURONTIN) 100 MG capsule as needed (neuropathy).   glipiZIDE (GLUCOTROL XL) 2.5 MG 24 hr tablet as needed (flareup).   lisinopril (ZESTRIL) 20 MG tablet Take 1.5 tablets (30 mg total) by mouth daily.   loratadine (CLARITIN) 10 MG tablet as needed for allergies.   Multiple Vitamin (MULTI VITAMIN MENS PO) Take by mouth daily at 6 (six) AM.   nitroGLYCERIN (NITROSTAT) 0.4 MG SL tablet Place 1 tablet (0.4 mg total) under the tongue every 5 (five) minutes as needed for chest pain.   olopatadine (PATANOL) 0.1 % ophthalmic solution Place 1 drop into both eyes 2 (two) times daily.   pantoprazole (PROTONIX) 40 MG tablet TAKE 1 TABLET BY MOUTH DAILY AS NEEDED   pravastatin (PRAVACHOL) 80 MG tablet Take 1 tablet (80 mg total) by mouth every evening.   predniSONE (DELTASONE) 10 MG tablet Take 10 mg by mouth as needed (gout).   SYMBICORT 80-4.5 MCG/ACT inhaler Inhale into the lungs daily at 6 (six) AM.   [DISCONTINUED] carvedilol (COREG) 12.5 MG tablet Take by mouth.   [DISCONTINUED] lisinopril (ZESTRIL) 20 MG  tablet Take 20 mg by mouth daily.   [DISCONTINUED] methylPREDNISolone (MEDROL DOSEPAK) 4 MG TBPK tablet as needed (gout).     Allergies:   Bactrim [sulfamethoxazole-trimethoprim], Atorvastatin, Crestor [rosuvastatin], Fenofibrate, Icosapent ethyl, Metformin, and Penicillins   Social History   Socioeconomic History   Marital status: Married    Spouse name: Not on file   Number of children: Not on file   Years of education: Not on file   Highest education level: Not on file  Occupational History   Not on file  Tobacco Use   Smoking status: Never   Smokeless tobacco: Never  Substance and Sexual Activity   Alcohol  use: Yes    Comment: occ beer   Drug use: No   Sexual activity: Yes  Other Topics Concern   Not on file  Social History Narrative   Not on file   Social Determinants of Health   Financial Resource Strain: Not on file  Food Insecurity: Not on file  Transportation Needs: Not on file  Physical Activity: Not on file  Stress: Not on file  Social Connections: Not on file    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  ROS:   Please see the history of present illness.     All other systems reviewed and are negative.  EKGs/Labs/Other Studies Reviewed:    The following studies were reviewed today: Cardiac Studies & Procedures         MONITORS  LONG TERM MONITOR (3-14 DAYS) 05/27/2022  Narrative   Patient had a minimum heart rate of 42 bpm, maximum heart rate of 162 bpm, and average heart rate of 66 bpm.   Predominant underlying rhythm was sinus rhythm .   Paroxysmal SVT  lasting 6 beats at longest with a max rate of 162 bpm at fastest.   Isolated PACs were rare (<1.0%).   Isolated PVCs were rare (<1.0%).   Triggered and diary events associated with sinus rhythm and PACs.  No malignant arrhythmias.   CT SCANS  CT CORONARY MORPH W/CTA COR W/SCORE 05/28/2022  Addendum 05/28/2022 11:46 PM ADDENDUM REPORT: 05/28/2022 23:44  EXAM: OVER-READ INTERPRETATION  CT CHEST  The following report is an over-read performed by radiologist Dr. Myra Gianotti [facility] on 05/28/2022. This over-read does not include interpretation of cardiac or coronary anatomy or pathology. The coronary CTA interpretation by the cardiologist is attached.  COMPARISON:  CT chest 01/16/2021  FINDINGS: No central pulmonary embolus. The imaged portion of the mediastinum is unremarkable. No visualized focal consolidation,  pleural effusion, or pneumothorax. No acute abnormality in the visualized upper abdomen. No acute osseous abnormality.  IMPRESSION: No acute abnormality.   Electronically Signed By: Minerva Fester M.D. On: 05/28/2022 23:44  Narrative HISTORY: Chest pain, nonspecific  EXAM: Cardiac/Coronary CT  TECHNIQUE: The patient was scanned on a [employer].  PROTOCOL: A 120 kV prospective scan was triggered in the descending thoracic aorta at 111 HU's. Axial non-contrast 3 mm slices were carried out through the heart. The data set was analyzed on a dedicated work station and scored using the Agatston method. Gantry rotation speed was 250 msecs and collimation was 0.6 mm. Heart rate was optimized medically and sl NTG was given. The 3D data set was reconstructed in 5% intervals of the 35-75 % of the R-R cycle. Systolic and diastolic phases were analyzed  on a dedicated work station using MPR, MIP and VRT modes. The patient received OMNIPAQUE IOHEXOL 350 MG/ML SOLN of contrast.  FINDINGS: Coronary calcium score: The patient's coronary artery calcium score is 638, which places the patient in the 81 percentile.  Coronary arteries: Normal coronary origins.  Right dominance.  Right Coronary Artery: Normal caliber vessel, gives rise to PDA. Proximal RCA with focal calcified plaque with 1-24% stenosis. Mid RCA with focal calcified plaque with 1-24% stenosis. PDA with focal calcified plaque and 1-24% stenosis.  Left Main Coronary Artery: Normal caliber vessel. No significant plaque or stenosis.  Left Anterior Descending Coronary Artery: Normal caliber vessel. Scattered predominantly calcified plaque in proximal vessel with maximum 25-49% stenosis. Gives rise to three small caliber diagonal branches.  Left Circumflex Artery: Normal caliber vessel. No significant plaque or stenosis. Gives rise to small first and medium second OM branches.  Aorta: Normal size, 36 mm at  the mid ascending aorta (level of the PA bifurcation) measured double oblique. No aortic atherosclerosis. No dissection seen in visualized portions of the aorta.  Aortic Valve: No calcifications. Trileaflet.  Other findings:  Normal pulmonary vein drainage into the left atrium.  Normal left atrial appendage without a thrombus.  Normal size of the pulmonary artery.  Normal appearance of the pericardium.  IMPRESSION: 1. Mild nonobstructive CAD, CADRADS = 2. Proximal LAD with 25-49% stenosis and scattered calcified plaque.  2. Coronary calcium score of 638. This was 81st percentile for age and sex matched control.  3. Normal coronary origin with right dominance.  INTERPRETATION:  CAD-RADS 2: Mild non-obstructive CAD (25-49%). Consider non-atherosclerotic causes of chest pain. Consider preventive therapy and risk factor modification.  Electronically Signed: By: Jodelle Red M.D. On: 05/26/2022 14:28           Recent Labs: 05/04/2022: BUN 15; Creatinine, Ser 0.69; Potassium 4.7; Sodium 143  Recent Lipid Panel    Component Value Date/Time   CHOL 179 06/25/2020 0930   TRIG 890 (H) 06/25/2020 0930   HDL 39 (L) 06/25/2020 0930   CHOLHDL 4.6 06/25/2020 0930   LDLCALC  06/25/2020 0930     Comment:     . LDL cholesterol not calculated. Triglyceride levels greater than 400 mg/dL invalidate calculated LDL results. . Reference range: <100 . Desirable range <100 mg/dL for primary prevention;   <70 mg/dL for patients with CHD or diabetic patients  with > or = 2 CHD risk factors. Marland Kitchen LDL-C is now calculated using the Martin-Hopkins  calculation, which is a validated novel method providing  better accuracy than the Friedewald equation in the  estimation of LDL-C.  Horald Pollen et al. Lenox Ahr. 2130;865(78): 2061-2068  (http://education.[employer].com/faq/FAQ164)         Physical Exam:    VS:  BP (!) 140/90   Pulse (!) 59   Ht 5\' 11"  (1.803 m)   Wt 250 lb  (113.4 kg)   SpO2 99%   BMI 34.87 kg/m     Wt Readings from Last 3 Encounters:  07/25/22 250 lb (113.4 kg)  05/04/22 242 lb 3.2 oz (109.9 kg)  02/02/22 245 lb 9.5 oz (111.4 kg)    GEN:  Well nourished, well developed in no acute distress HEENT: Frank's sIgn NECK: No JVD CARDIAC: RRR, no murmurs, rubs, gallops RESPIRATORY:  Clear to auscultation without rales, wheezing or rhonchi  ABDOMEN: Soft, non-tender, non-distended MUSCULOSKELETAL:  No edema; No deformity  SKIN: Warm and dry NEUROLOGIC:  Alert and oriented x 3 PSYCHIATRIC:  Normal affect  ASSESSMENT:    1. Aortic atherosclerosis (HCC)   2. Hyperlipidemia LDL goal <70   3. Coronary artery disease of native artery of native heart with stable angina pectoris (HCC)   4. Primary hypertension     PLAN:    HTN - BP is above goal; felt orthostatic with initial medication change - will increase to lisinopril 30 mg; there is no [facility] in [place]; will get BMP in 1-2 weeks - continue AMB BP monitoring  Mild non obstructive CAD Aortic atherosclerosis Mixed HLD Statin myopathy (on atorvastatin, rosuvastatin; tolerates low does pravastatin only) - continue ASA and statin - reviewed CT with Pt and wife - will check lipids and ALT; he may need zetia 10 mg - low threshold for PCSK9i start - discussed dietary changes; he is more active than the winter and may not need additional medication (LDL goal < 70 after SDM)  PACs - controlled on current BB  One year with me        Medication Adjustments/Labs and Tests Ordered: Current medicines are reviewed at length with the patient today.  Concerns regarding medicines are outlined above.  Orders Placed This Encounter  Procedures   ALT   Lipid panel   Meds ordered this encounter  Medications   lisinopril (ZESTRIL) 20 MG tablet    Sig: Take 1.5 tablets (30 mg total) by mouth daily.    Dispense:  135 tablet    Refill:  3    Patient Instructions  Medication  Instructions:  Your physician has recommended you make the following change in your medication:  INCREASE: lisinopril to 30 mg by mouth once daily  *If you need a refill on your cardiac medications before your next appointment, please call your pharmacy*   Lab Work: IN 2 WEEKS: BMP, FLP, ALT (nothing to eat or drink 12 hours prior except water and black coffee)  If you have labs (blood work) drawn today and your tests are completely normal, you will receive your results only by: MyChart Message (if you have MyChart) OR A paper copy in the mail If you have any lab test that is abnormal or we need to change your treatment, we will call you to review the results.   Testing/Procedures: NONE   Follow-Up: At [facility], you and your health needs are our priority.  As part of our continuing mission to provide you with exceptional heart care, we have created designated Provider Care Teams.  These Care Teams include your primary Cardiologist (physician) and Advanced Practice Providers (APPs -  Physician Assistants and Nurse Practitioners) who all work together to provide you with the care you need, when you need it.      Your next appointment:   1 year(s)  Provider:   Riley Lam, MD       Signed, Christell Constant, MD  07/25/2022 9:24 AM    [facility]

## 2022-07-25 ENCOUNTER — Encounter: Payer: Self-pay | Admitting: Internal Medicine

## 2022-07-25 ENCOUNTER — Ambulatory Visit: Payer: Medicare PPO | Attending: Internal Medicine | Admitting: Internal Medicine

## 2022-07-25 VITALS — BP 140/90 | HR 59 | Ht 71.0 in | Wt 250.0 lb

## 2022-07-25 DIAGNOSIS — E785 Hyperlipidemia, unspecified: Secondary | ICD-10-CM | POA: Diagnosis not present

## 2022-07-25 DIAGNOSIS — I700 Atherosclerosis of aorta: Secondary | ICD-10-CM | POA: Diagnosis not present

## 2022-07-25 DIAGNOSIS — I10 Essential (primary) hypertension: Secondary | ICD-10-CM | POA: Diagnosis not present

## 2022-07-25 DIAGNOSIS — I25118 Atherosclerotic heart disease of native coronary artery with other forms of angina pectoris: Secondary | ICD-10-CM | POA: Diagnosis not present

## 2022-07-25 MED ORDER — LISINOPRIL 20 MG PO TABS: 30.0000 mg | ORAL_TABLET | Freq: Every day | ORAL | 3 refills | Status: DC

## 2022-07-25 NOTE — Patient Instructions (Addendum)
Medication Instructions:  Your physician has recommended you make the following change in your medication:  INCREASE: lisinopril to 30 mg by mouth once daily  *If you need a refill on your cardiac medications before your next appointment, please call your pharmacy*   Lab Work: IN 2 WEEKS: BMP, FLP, ALT (nothing to eat or drink 12 hours prior except water and black coffee)  If you have labs (blood work) drawn today and your tests are completely normal, you will receive your results only by: MyChart Message (if you have MyChart) OR A paper copy in the mail If you have any lab test that is abnormal or we need to change your treatment, we will call you to review the results.   Testing/Procedures: NONE   Follow-Up: At [facility], you and your health needs are our priority.  As part of our continuing mission to provide you with exceptional heart care, we have created designated Provider Care Teams.  These Care Teams include your primary Cardiologist (physician) and Advanced Practice Providers (APPs -  Physician Assistants and Nurse Practitioners) who all work together to provide you with the care you need, when you need it.      Your next appointment:   1 year(s)  Provider:   Riley Lam, MD

## 2022-07-25 NOTE — Addendum Note (Signed)
Addended by: Macie Burows on: 07/25/2022 09:35 AM   Modules accepted: Orders

## 2022-07-28 ENCOUNTER — Telehealth: Payer: Self-pay | Admitting: Internal Medicine

## 2022-07-28 NOTE — Telephone Encounter (Signed)
Pt c/o medication issue:  1. Name of Medication: lisinopril (ZESTRIL) 20 MG tablet   2. How are you currently taking this medication (dosage and times per day)? Take 1.5 tablets (30 mg total) by mouth daily.   3. Are you having a reaction (difficulty breathing--STAT)? no  4. What is your medication issue? Calling with question about the medication. States that he patient supposed to be taking 2 tables a day, but states that dr C wanted him to take another half pill. Please advise

## 2022-07-28 NOTE — Telephone Encounter (Signed)
Called pt advised MD increased lisinopril to 30 mg PO QD at last OV.  Pt is to take 1.5 pills daily to equal 30 mg.  Advised pt to call the office with questions. DPR ok to leave detailed messages.

## 2022-08-08 ENCOUNTER — Ambulatory Visit: Payer: Medicare PPO | Attending: Internal Medicine

## 2022-08-08 DIAGNOSIS — I10 Essential (primary) hypertension: Secondary | ICD-10-CM

## 2022-08-08 DIAGNOSIS — I700 Atherosclerosis of aorta: Secondary | ICD-10-CM

## 2022-08-08 DIAGNOSIS — E785 Hyperlipidemia, unspecified: Secondary | ICD-10-CM | POA: Diagnosis not present

## 2022-08-08 LAB — BASIC METABOLIC PANEL
BUN/Creatinine Ratio: 23 (ref 10–24)
BUN: 14 mg/dL (ref 8–27)
CO2: 23 mmol/L (ref 20–29)
Calcium: 9.1 mg/dL (ref 8.6–10.2)
Chloride: 102 mmol/L (ref 96–106)
Creatinine, Ser: 0.6 mg/dL — ABNORMAL LOW (ref 0.76–1.27)
Glucose: 108 mg/dL — ABNORMAL HIGH (ref 70–99)
Potassium: 4.6 mmol/L (ref 3.5–5.2)
Sodium: 141 mmol/L (ref 134–144)
eGFR: 105 mL/min/{1.73_m2} (ref >59)

## 2022-08-08 LAB — LIPID PANEL
Chol/HDL Ratio: 3.6 ratio (ref 0.0–5.0)
Cholesterol, Total: 148 mg/dL (ref 100–199)
HDL: 41 mg/dL (ref >39)
LDL Chol Calc (NIH): 24 mg/dL (ref 0–99)
Triglycerides: 633 mg/dL (ref 0–149)
VLDL Cholesterol Cal: 83 mg/dL — ABNORMAL HIGH (ref 5–40)

## 2022-08-08 LAB — ALT: ALT: 18 IU/L (ref 0–44)

## 2022-08-11 ENCOUNTER — Telehealth: Payer: Self-pay

## 2022-08-11 NOTE — Telephone Encounter (Signed)
The patient has been notified of the result and verbalized understanding.  All questions (if any) were answered. Macie Burows, RN 08/11/2022 4:00 PM   Pt reports Dr. Rennis Golden previously prescribed Vascepa and he could not tolerate med.  Also reports PCP increased Lisinopril to 20 mg PO Qam and 20 mg Qpm for a total of 40 mg PO QD.  Per pt BP 140/80's.  Advised pt will update medication record and send to MD to advise.

## 2022-08-11 NOTE — Telephone Encounter (Signed)
-----   Message from Christell Constant, MD sent at 08/09/2022  1:37 PM EDT ----- Results: LDL at goal TGS still elevated Plan: If interested in Vascepa will collaborate with lipid clinic  Christell Constant, MD

## 2022-08-15 ENCOUNTER — Telehealth: Payer: Self-pay

## 2022-08-15 ENCOUNTER — Other Ambulatory Visit (HOSPITAL_COMMUNITY): Payer: Self-pay

## 2022-08-15 ENCOUNTER — Ambulatory Visit: Payer: Medicare PPO | Admitting: Internal Medicine

## 2022-08-15 NOTE — Telephone Encounter (Signed)
PER TEST CLAIM:

## 2022-08-18 MED ORDER — OMEGA-3-ACID ETHYL ESTERS 1 G PO CAPS: 2.0000 g | ORAL_CAPSULE | Freq: Two times a day (BID) | ORAL | 11 refills | Status: DC

## 2022-08-18 NOTE — Telephone Encounter (Signed)
Called pt advised of recommendation for triglycerides: Lovaza 2 g twice daily.  Received a warning that component of med pt has an allergy to. Sent a message to pharmacist who advises Lovaza has properties that help with gout.  Pt can try medication to see if can tolerate or try a different med.  Called pt advised of the above.  Pt request 30 day supply med to be sent into pharmacy.  Pt will notify office if can't tolerate med.

## 2022-09-08 ENCOUNTER — Telehealth: Payer: Self-pay | Admitting: Internal Medicine

## 2022-09-08 NOTE — Telephone Encounter (Signed)
Pt c/o medication issue:  1. Name of Medication:  Fish Oil  2. How are you currently taking this medication (dosage and times per day)?   3. Are you having a reaction (difficulty breathing--STAT)?   4. What is your medication issue?   Patient's wife states Fish Oil cause gout on 2 occasions and patient cannot tolerate it. She would like a call back to discuss.

## 2022-09-13 DIAGNOSIS — Z86010 Personal history of colonic polyps: Secondary | ICD-10-CM | POA: Diagnosis not present

## 2022-09-13 DIAGNOSIS — D122 Benign neoplasm of ascending colon: Secondary | ICD-10-CM | POA: Diagnosis not present

## 2022-09-13 DIAGNOSIS — D124 Benign neoplasm of descending colon: Secondary | ICD-10-CM | POA: Diagnosis not present

## 2022-09-13 DIAGNOSIS — Z09 Encounter for follow-up examination after completed treatment for conditions other than malignant neoplasm: Secondary | ICD-10-CM | POA: Diagnosis not present

## 2022-09-13 DIAGNOSIS — D123 Benign neoplasm of transverse colon: Secondary | ICD-10-CM | POA: Diagnosis not present

## 2022-09-13 DIAGNOSIS — K648 Other hemorrhoids: Secondary | ICD-10-CM | POA: Diagnosis not present

## 2022-09-14 ENCOUNTER — Encounter: Payer: Self-pay | Admitting: Internal Medicine

## 2022-09-14 DIAGNOSIS — I25118 Atherosclerotic heart disease of native coronary artery with other forms of angina pectoris: Secondary | ICD-10-CM

## 2022-09-14 DIAGNOSIS — E785 Hyperlipidemia, unspecified: Secondary | ICD-10-CM

## 2022-09-14 DIAGNOSIS — I700 Atherosclerosis of aorta: Secondary | ICD-10-CM

## 2022-09-21 DIAGNOSIS — D124 Benign neoplasm of descending colon: Secondary | ICD-10-CM | POA: Diagnosis not present

## 2022-09-26 NOTE — Telephone Encounter (Signed)
Placed referral for lipid clinic per MD note: Let's having him come in to lipid clinic.  Im not so sure that he will get the PCKS9inhibitor from insurance for Hypertryglceridemia given intolerance to vascepa and lovaza (gout).  We may just have to work out secondary gout prevention, but we can see what he feels comfortable trying.   Pt scheduled for OV 10/18/22 at 1:30 pm.  Appointment reminder mailed out.

## 2022-09-29 DIAGNOSIS — E1142 Type 2 diabetes mellitus with diabetic polyneuropathy: Secondary | ICD-10-CM | POA: Diagnosis not present

## 2022-09-29 DIAGNOSIS — I700 Atherosclerosis of aorta: Secondary | ICD-10-CM | POA: Diagnosis not present

## 2022-09-29 DIAGNOSIS — M109 Gout, unspecified: Secondary | ICD-10-CM | POA: Diagnosis not present

## 2022-09-29 DIAGNOSIS — Z79899 Other long term (current) drug therapy: Secondary | ICD-10-CM | POA: Diagnosis not present

## 2022-09-29 DIAGNOSIS — E669 Obesity, unspecified: Secondary | ICD-10-CM | POA: Diagnosis not present

## 2022-09-29 DIAGNOSIS — I10 Essential (primary) hypertension: Secondary | ICD-10-CM | POA: Diagnosis not present

## 2022-09-29 DIAGNOSIS — Z0000 Encounter for general adult medical examination without abnormal findings: Secondary | ICD-10-CM | POA: Diagnosis not present

## 2022-09-29 DIAGNOSIS — E1169 Type 2 diabetes mellitus with other specified complication: Secondary | ICD-10-CM | POA: Diagnosis not present

## 2022-09-29 DIAGNOSIS — K219 Gastro-esophageal reflux disease without esophagitis: Secondary | ICD-10-CM | POA: Diagnosis not present

## 2022-09-29 DIAGNOSIS — E1136 Type 2 diabetes mellitus with diabetic cataract: Secondary | ICD-10-CM | POA: Diagnosis not present

## 2022-10-08 ENCOUNTER — Other Ambulatory Visit: Payer: Self-pay | Admitting: Internal Medicine

## 2022-10-08 DIAGNOSIS — E785 Hyperlipidemia, unspecified: Secondary | ICD-10-CM

## 2022-10-18 ENCOUNTER — Ambulatory Visit: Payer: Medicare PPO | Attending: Internal Medicine | Admitting: Pharmacist

## 2022-10-18 DIAGNOSIS — E781 Pure hyperglyceridemia: Secondary | ICD-10-CM | POA: Diagnosis not present

## 2022-10-18 DIAGNOSIS — E785 Hyperlipidemia, unspecified: Secondary | ICD-10-CM

## 2022-10-18 MED ORDER — FENOFIBRATE 120 MG PO TABS: 1.0000 | ORAL_TABLET | Freq: Every day | ORAL | 11 refills | Status: DC

## 2022-10-18 NOTE — Patient Instructions (Addendum)
Start taking fenofibrate 120mg  - 1 tablet once daily with breakfast  Continue taking your other medications including pravastatin  If your aches return, you can decrease your dose back to 40mg  (1/2 tablet) daily  If you have any side effects with this dose of fenofibrate, call Aundra Millet PharmD #[phone] and we can try a different formulation/dose  Recheck fasting labs on Monday, September 23 any time after 7:30am

## 2022-10-18 NOTE — Progress Notes (Addendum)
Patient ID: Theodore Ballard                 DOB: 1955-07-16                    MRN: [id number]     HPI: Theodore Ballard is a 67 y.o. adult patient referred to lipid clinic by Dr Izora Ribas. 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 TG as high as 1536 in the past. Has seen Dr Rennis Golden previously for his TG. Intolerant to fish oil, fenofibrate, and rosuvastatin. Referred for CORE clinical trial evaluating RNA inhibitor of APOC3 however did not wish to participate.  Pt presents today for follow up. Had been cutting his pravastatin in half, has since increased back to 80mg  and is tolerating ok (previously experienced some cramping). Intolerant to atorvastatin, rosuvastatin (myalgias with both), fibrates (jitteriness), and all formulations of fish oil (gout flares). Gout is well controlled otherwise without flares until he starts on fish oil, flare precipitated in about a week's time. Has also taken niacin which caused a rash on his face and trunk as well as breathing issues. Has been making dietary improvements including cutting out soda and cutting back on bread and lost close to 40 lbs. His mother had high triglycerides and history of pancreatitis.  Current Medications: pravastatin 80mg  daily  Intolerances:  Atorvastatin - myalgias Rosuvastatin 20mg  daily - weakness, leg pain, nausea Fenofibrate 145mg  daily - jitteriness Gemfibrozil 600mg  - nervous/shakiness OTC fish oil, Lovaza, and Vascepa - gout Niacin - red rash on face and trunk, breathing issues, went to the ER  Risk Factors: CAD LDL goal: 70mg /dL TG goal: < 150mg /dL  Diet: Stopped drinking soda, cut back on bread. No fried food. Tomasa Blase once a week.  Exercise: busy working on the farm, stays active  Family History: Father with HTN and heart disease. Mother and sister with  cancer.  Social History: No tobacco, alcohol or drug use.  Labs: 08/08/22: TC 148, TG 633, HDL 41, LDL 24 (falsely low due to elevated TG)  Past Medical History:  Diagnosis Date   Anxiety    Arthritis    Environmental allergies    GERD (gastroesophageal reflux disease)    Gout    Hyperlipidemia    Hypertension     Current Outpatient Medications on File Prior to Visit  Medication Sig Dispense Refill   allopurinol (ZYLOPRIM) 300 MG tablet Take 1 tablet (300 mg total) by mouth daily. 90 tablet 3   ASPIRIN 81 PO daily at 6 (six) AM.     augmented betamethasone dipropionate (DIPROLENE-AF) 0.05 % cream Apply topically as needed (rash).     carvedilol (COREG) 25 MG tablet Take 25 mg by mouth 2 (two) times daily with a meal.     Cholecalciferol (VITAMIN D3 PO) daily.     Cyanocobalamin (VITAMIN B 12 PO) daily at 6 (six) AM.     diclofenac Sodium (VOLTAREN) 1 % GEL Apply topically as needed (pain).     diphenoxylate-atropine (LOMOTIL) 2.5-0.025 MG tablet Take 1-2 tablets by mouth 4 (four) times daily as needed for diarrhea or loose stools. 30 tablet 0   gabapentin (NEURONTIN) 100 MG capsule as needed (neuropathy).     glipiZIDE (GLUCOTROL XL) 2.5 MG 24 hr tablet as needed (flareup).     lisinopril (ZESTRIL) 20 MG tablet Take 20 mg by mouth 2 (two)  times daily.     loratadine (CLARITIN) 10 MG tablet as needed for allergies.     Multiple Vitamin (MULTI VITAMIN MENS PO) Take by mouth daily at 6 (six) AM.     nitroGLYCERIN (NITROSTAT) 0.4 MG SL tablet Place 1 tablet (0.4 mg total) under the tongue every 5 (five) minutes as needed for chest pain. 90 tablet 3   olopatadine (PATANOL) 0.1 % ophthalmic solution Place 1 drop into both eyes 2 (two) times daily. 5 mL 12   omega-3 acid ethyl esters (LOVAZA) 1 g capsule Take 2 capsules (2 g total) by mouth 2 (two) times daily. 120 capsule 11   pantoprazole (PROTONIX) 40 MG tablet TAKE 1 TABLET BY MOUTH DAILY AS NEEDED 90 tablet 1   pravastatin  (PRAVACHOL) 80 MG tablet TAKE 1 TABLET BY MOUTH EVERY EVENING 90 tablet 2   predniSONE (DELTASONE) 10 MG tablet Take 10 mg by mouth as needed (gout).     SYMBICORT 80-4.5 MCG/ACT inhaler Inhale into the lungs daily at 6 (six) AM.     No current facility-administered medications on file prior to visit.    Allergies  Allergen Reactions   Bactrim [Sulfamethoxazole-Trimethoprim] Rash   Atorvastatin     myalgias   Crestor [Rosuvastatin]    Fenofibrate     Gout flare up   Icosapent Ethyl Other (See Comments)    Gout flareup   Metformin     Other Reaction(s): GI upset, cramps   Penicillins Swelling and Rash    Assessment/Plan:  1. Familial hypertriglyceridemia - TG most recently 633, probably will not be able to get him to < 150, however ideally need to lower to < 500 to reduce risk of pancreatitis. He is tolerating pravastatin 80mg  daily. Intolerant to atorvastatin, rosuvastatin, fenofibrate, gemfibrozil, all fish oils (OTC, Vascepa, Lovaza), and niacin. PCSK9i and Leqvio don't lower TG (his LDL is also well controlled). Discussed limiting carbs/sugar/alcohol in his diet and staying active. Of his prior side effects, fenofibrate makes the most sense to rechallenge with as he experienced jitteriness on 145mg  dose which is not a common side effect. Will retry at different dose of 120mg  daily. He is aware to take with food. Will recheck fasting lipids including direct LDL in 2 months. He will continue on his pravastatin. Advised him to call if he experiences any recurrence of jitteriness and would try different dose/formulation of fenofibrate. Advised if he experiences any cramping to decrease his pravastatin back to 40mg .  Lakeena Downie E. Laron Boorman, PharmD, BCACP, CPP [facility] [address], [place], [place] [address] Phone: [phone]; Fax: [phone] 10/18/2022 1:49 PM

## 2022-10-24 ENCOUNTER — Telehealth: Payer: Self-pay | Admitting: Pharmacist

## 2022-10-24 NOTE — Telephone Encounter (Signed)
Pt called clinic and left message. Reports he cannot tolerate fenofibrate secondary to headache, nausea, cramping in hands/arms and overall aches like he has the flu. Felt jittery yesterday. Does not want to try lower dose of the medication.  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 Currently taking pravastatin 80mg  daily.  Management complicated by a multitude of medication intolerances including:  Atorvastatin - myalgias Rosuvastatin 20mg  daily - weakness, leg pain, nausea Fenofibrate 145mg  daily - jitteriness Gemfibrozil 600mg  - nervous/shakiness OTC fish oil, Lovaza, and Vascepa - gout flares (while already on allopurinol) Niacin - red rash on face and trunk, breathing issues, went to the ER  He was previously referred by Dr Rennis Golden for clinical TG trial, he was not interested and trial has since closed for enrollment.  Does not have DM, doesn't drink alcohol, have discussed limiting carb/sugar intake and increasing physical activity.  Unfortunately do not have any other medication options to offer him as he is intolerant to all TG lowering medications. PCSK9i, inclisiran and Nexlizet do not lower TG and wouldn't be covered by insurance since his LDL is well controlled. He will continue on his pravastatin.

## 2022-12-11 ENCOUNTER — Other Ambulatory Visit: Payer: Medicare PPO

## 2022-12-14 DIAGNOSIS — R3915 Urgency of urination: Secondary | ICD-10-CM | POA: Diagnosis not present

## 2022-12-14 DIAGNOSIS — R972 Elevated prostate specific antigen [PSA]: Secondary | ICD-10-CM | POA: Diagnosis not present

## 2023-01-02 DIAGNOSIS — M19012 Primary osteoarthritis, left shoulder: Secondary | ICD-10-CM | POA: Diagnosis not present

## 2023-03-05 DIAGNOSIS — Z23 Encounter for immunization: Secondary | ICD-10-CM | POA: Diagnosis not present

## 2023-04-26 IMAGING — MR MR SHOULDER*L* W/O CM
5 series · 35 of 40 positions shown · non-contrast
Comparison: None Available.

CLINICAL DATA: Sharp pain in shoulder. Weakness and limited range
of motion.

EXAM:
MRI OF THE LEFT SHOULDER WITHOUT CONTRAST
TECHNIQUE: Multiplanar, multisequence MR imaging of the shoulder was performed.
No intravenous contrast was administered.

[Series 3: T2 fat-sat · axial · 4.0mm · 0.66mm/px · z∈[-79,+33]mm · 8 of 28 slices shown (1 of 3)]
[im 1/28]
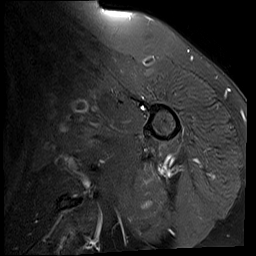
[im 4/28]
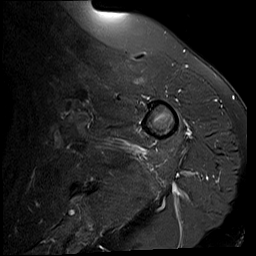
[im 8/28]
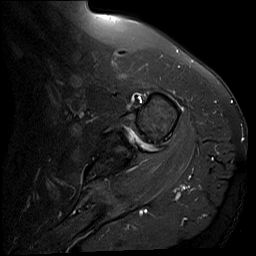
[im 12/28]
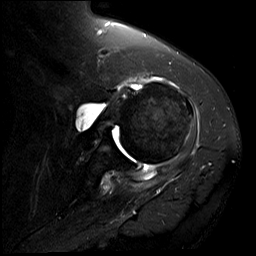
[im 16/28]
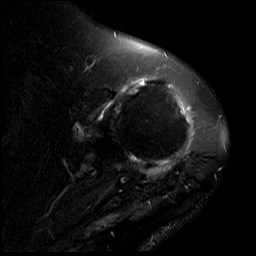
[im 20/28]
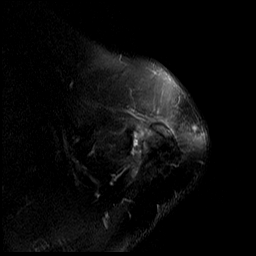
[im 24/28]
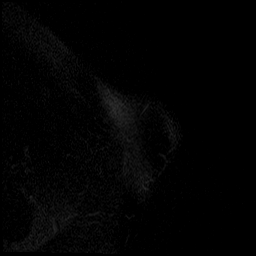
[im 28/28]
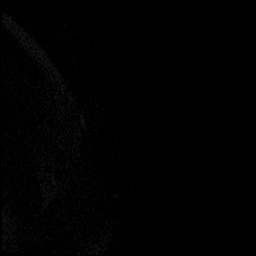

[Series 4: T2 fat-sat · oblique · 4.0mm · 0.62mm/px · 8 of 26 slices shown (2 of 3)]
[im 1/26]
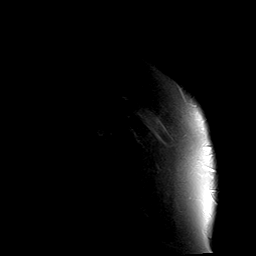
[im 4/26]
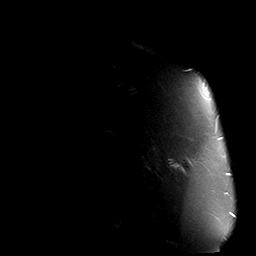
[im 8/26]
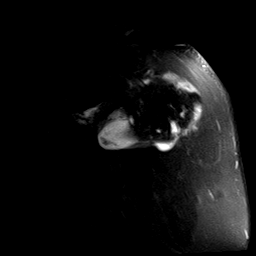
[im 11/26]
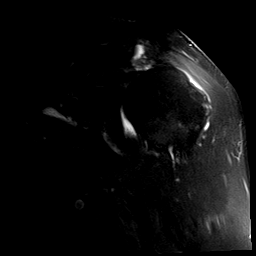
[im 15/26]
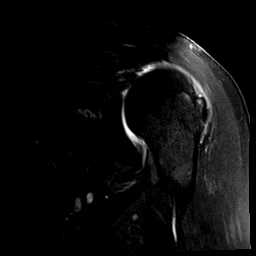
[im 18/26]
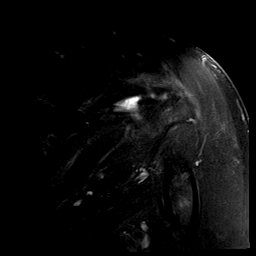
[im 22/26]
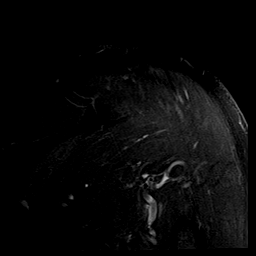
[im 26/26]
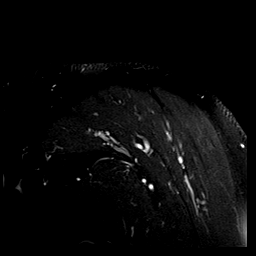

[Series 5: PD · oblique · 4.0mm · 0.31mm/px · 8 of 26 slices shown]
[im 1/26]
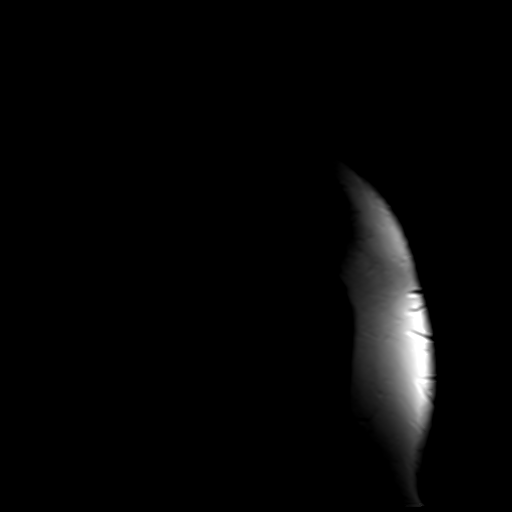
[im 4/26]
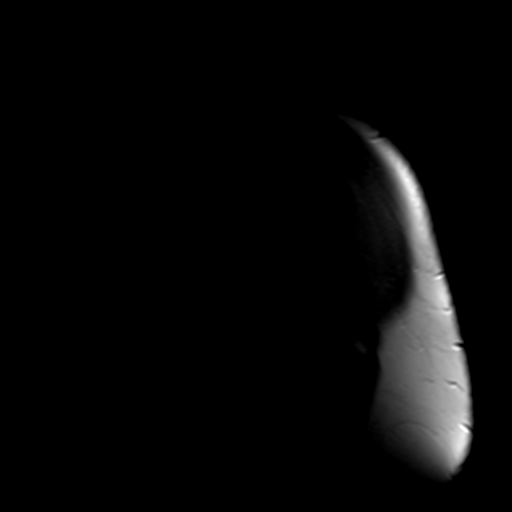
[im 8/26]
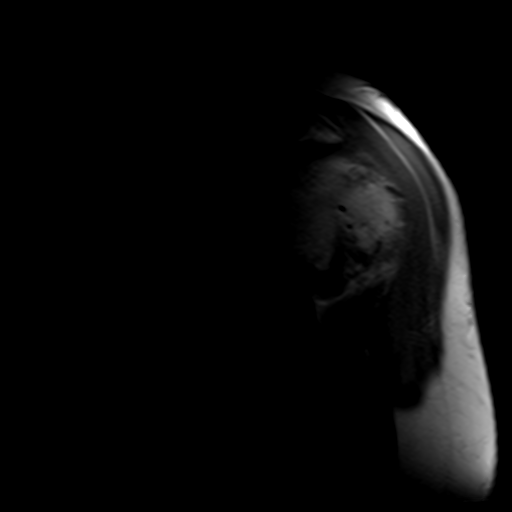
[im 11/26]
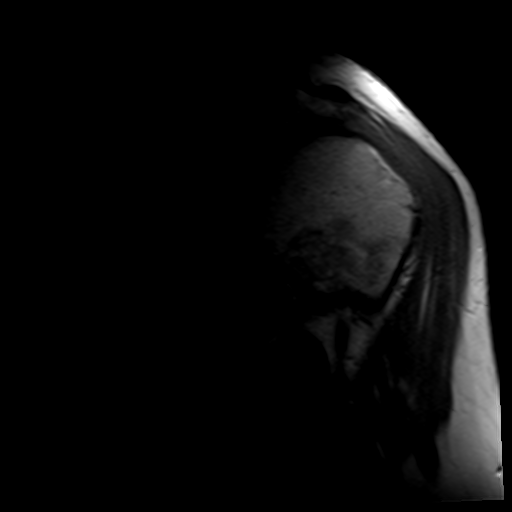
[im 15/26]
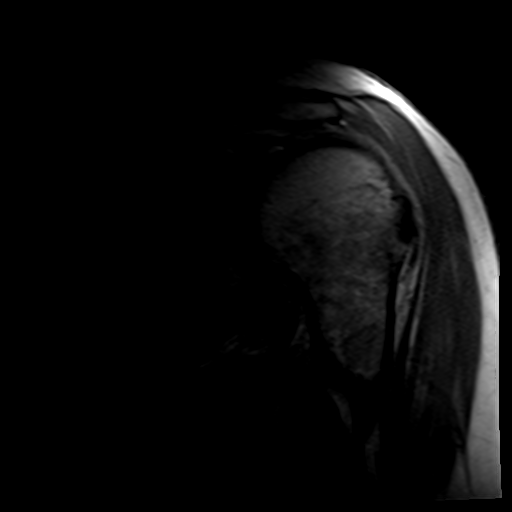
[im 18/26]
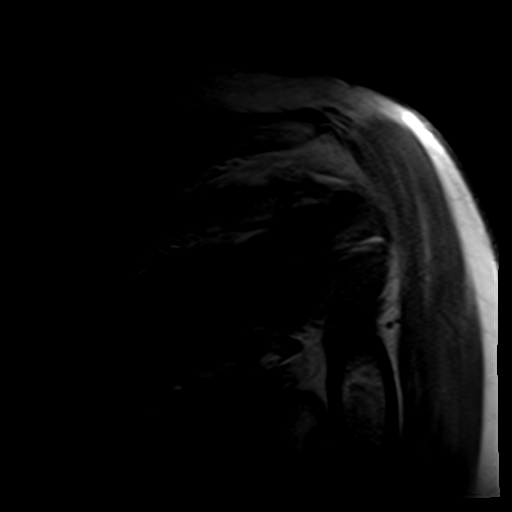
[im 22/26]
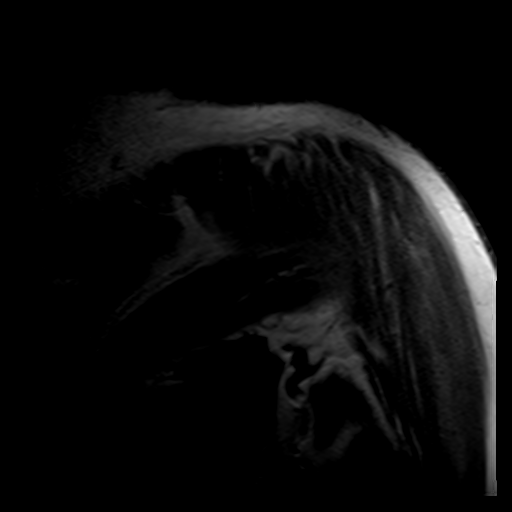
[im 26/26]
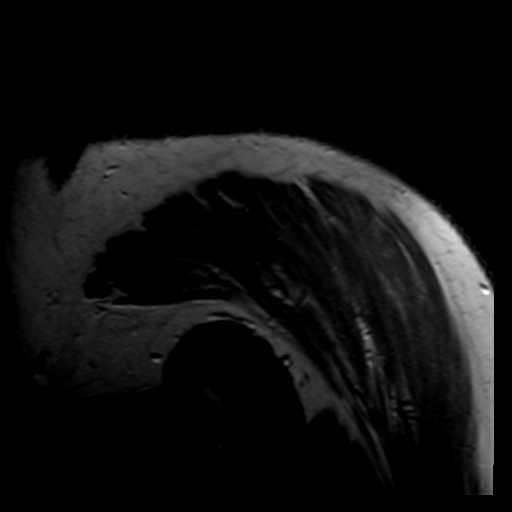

[Series 6: T2 fat-sat · oblique · 4.0mm · 0.66mm/px · 8 of 29 slices shown (3 of 3)]
[im 1/29]
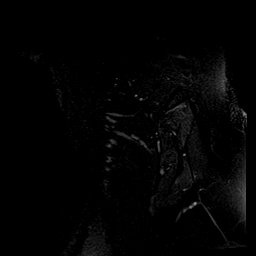
[im 5/29]
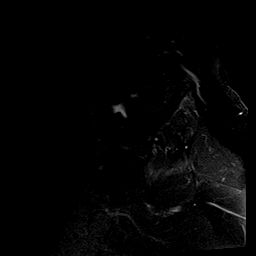
[im 9/29]
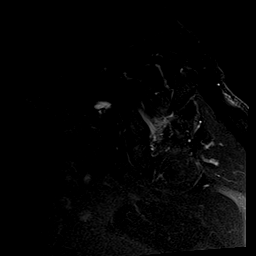
[im 13/29]
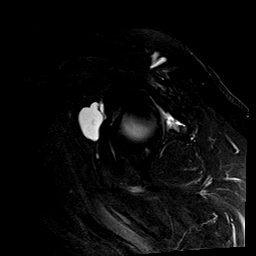
[im 17/29]
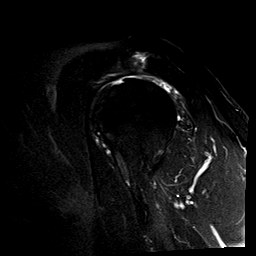
[im 21/29]
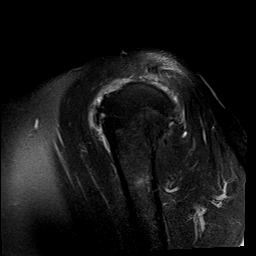
[im 25/29]
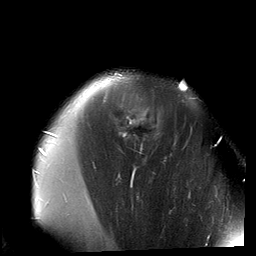
[im 29/29]
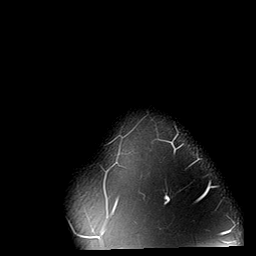

[Series 7: T1 · oblique · 4.0mm · 0.33mm/px · 3 of 29 slices shown]
[im 1/29]
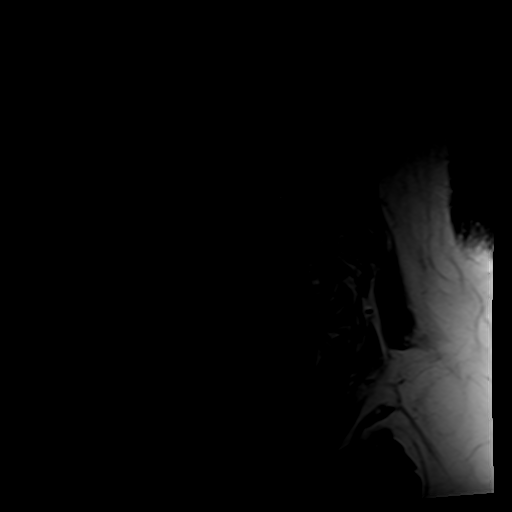
[im 5/29]
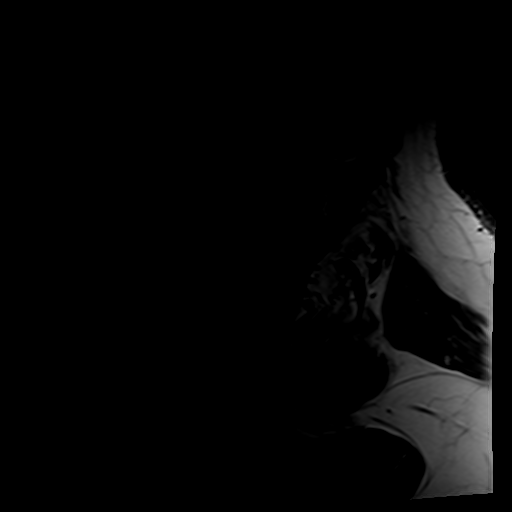
[im 9/29]
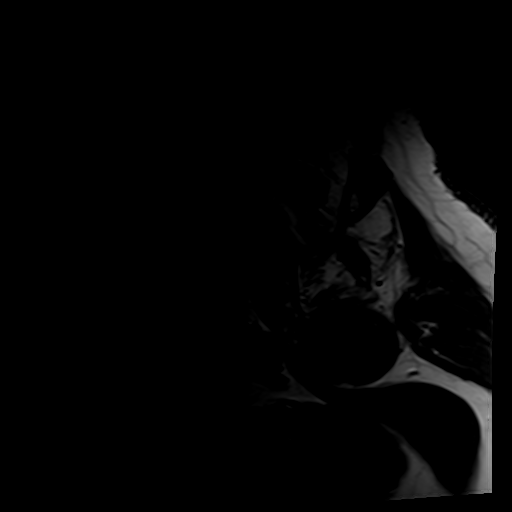

[35 of 40 positions shown; findings below may reference images not displayed]

FINDINGS: Rotator cuff: The humeral head is high-riding and nearly contacts
the undersurface of the acromion. There is a massive tear of the
entire supraspinatus and the majority of the infraspinatus tendon
footprints with minimal posterior infraspinatus intact tendon
fibers. At least 4.5 cm tendon retraction near the superior aspect
of the glenoid.

There is moderate attenuation of the articular side of the superior
subscapularis tendon, a partial-thickness tear measuring up to 12 mm
in craniocaudal dimension. The teres minor is intact.

Muscles: Severe anterior greater than posterior infraspinatus and
supraspinatus muscle atrophy and fatty infiltration. Moderate to
high-grade superior subscapularis muscle atrophy and mild fatty
infiltration.

Biceps long head: There is moderate attenuation of the proximal long
head of the biceps tendon with high-grade attenuation and high-grade
midsubstance fluid bright signal within the tendon within the
greater than distal aspect of the bicipital groove.

Acromioclavicular Joint: There are mild degenerative changes of the
acromioclavicular joint including joint space narrowing, subchondral
marrow edema, and peripheral osteophytosis. Type II acromion. Mild
subacromial/subdeltoid bursitis.

Glenohumeral Joint: Mild thinning of the glenoid and humeral head
cartilage.

Labrum: There is fluid bright signal indicating a chondrolabral
junction tear of the posteroinferior through the posterosuperior
glenoid quadrants (axial series 3 images 15 through 20).

Bones:  No acute fracture.

Other: Mild-to-moderate fluid within the subcoracoid bursa.
IMPRESSION: :
IMPRESSION: 1. Massive tear of the entire supraspinatus and the majority of the
infraspinatus tendon footprint. Severe anterior greater than
posterior infraspinatus and supraspinatus muscle atrophy and fatty
infiltration.
2. Moderate partial-thickness articular sided tear of the superior
subscapularis tendon with moderate to high-grade superior
subscapularis muscle atrophy.
3. Moderate to high-grade partial-thickness tears of the biceps
tendon as above.
4. Mild degenerative changes of the acromioclavicular joint. Diffuse
posterior glenoid chondrolabral junction tear.
5. Mild-to-moderate fluid within the subcoracoid bursa.

## 2023-06-15 DIAGNOSIS — M109 Gout, unspecified: Secondary | ICD-10-CM | POA: Diagnosis not present

## 2023-06-15 DIAGNOSIS — R002 Palpitations: Secondary | ICD-10-CM | POA: Diagnosis not present

## 2023-06-15 DIAGNOSIS — E1136 Type 2 diabetes mellitus with diabetic cataract: Secondary | ICD-10-CM | POA: Diagnosis not present

## 2023-06-15 DIAGNOSIS — E1142 Type 2 diabetes mellitus with diabetic polyneuropathy: Secondary | ICD-10-CM | POA: Diagnosis not present

## 2023-06-15 DIAGNOSIS — I10 Essential (primary) hypertension: Secondary | ICD-10-CM | POA: Diagnosis not present

## 2023-06-15 DIAGNOSIS — E1169 Type 2 diabetes mellitus with other specified complication: Secondary | ICD-10-CM | POA: Diagnosis not present

## 2023-06-15 DIAGNOSIS — E669 Obesity, unspecified: Secondary | ICD-10-CM | POA: Diagnosis not present

## 2023-06-15 DIAGNOSIS — K219 Gastro-esophageal reflux disease without esophagitis: Secondary | ICD-10-CM | POA: Diagnosis not present

## 2023-06-15 DIAGNOSIS — Z860100 Personal history of colon polyps, unspecified: Secondary | ICD-10-CM | POA: Diagnosis not present

## 2023-06-30 ENCOUNTER — Telehealth: Payer: Self-pay | Admitting: Cardiology

## 2023-06-30 NOTE — Telephone Encounter (Signed)
 Patient called in reporting his BP has been elevated over the past couple of days. Yesterday reports as high as 228/120 even after taking 4 doses of coreg and his lisinopril. This morning he has taken this coreg 25mg  and lisinopril 20mg  but BP remains elevated at 210/110. Reports he is asymptomatic. Had sausage and lunch meat yesterday as well. I advised that given his persistently elevated BP he should be evaluated in the ED. He was somewhat resistant to being evaluated, advised that he could follow his BP for another 1-2 but if remains >190 would be evaluated. Advised I would route to the office for possible appt the first of the week as well. He was agreeable to this plan and thanked me for callback.

## 2023-07-02 NOTE — Telephone Encounter (Signed)
 Spoke with pt regarding an appointment. An appointment was made with DOD on 4/15 at 9:45 AM. Pt verbalized understanding. All questions, if any, were answered.

## 2023-07-02 NOTE — Progress Notes (Unsigned)
  Electrophysiology Office Note:   Date:  07/03/2023  ID:  Theodore Ballard, Theodore Ballard [birth year], MRN [id number]  Primary Cardiologist: Christell Constant, MD Primary Heart Failure: None Electrophysiologist: None      History of Present Illness:   Theodore Ballard is a 68 y.o. adult with h/o elevated coronary calcium score, aortic atherosclerosis, hypertension, hyperlipidemia seen today for acute visit due to hypertension.    Patient reports issues with blood pressure.  He has had blood pressure for his entire life.  A few nights ago, he woke up to use the restroom.  He could hear his heart beating which is abnormal.  He checked his blood pressure and it was in the 220s over 120s.  He took an extra dose of carvedilol, did some breathing exercises, and feels that his blood pressure improved.  The next day, his blood pressure remained elevated.  His blood pressure is better controlled today, but is still high.  He feels well and is without major complaint at this time.  He did not have chest pain or shortness of breath when his blood pressure was elevated.  he denies chest pain, palpitations, dyspnea, PND, orthopnea, nausea, vomiting, dizziness, syncope, edema, weight gain, or early satiety.   Review of systems complete and found to be negative unless listed in HPI.   EP Information / Studies Reviewed:    EKG is ordered today. Personal review as below.  EKG Interpretation Date/Time:  Tuesday July 03 2023 09:52:36 EDT Ventricular Rate:  54 PR Interval:  178 QRS Duration:  104 QT Interval:  414 QTC Calculation: 392 R Axis:   -33  Text Interpretation: Sinus bradycardia Left axis deviation T wave inversion Inferior leads When compared with ECG of 27-Jan-2022 11:42, No significant change was found Confirmed by [employer], Dianna Ewald ([id number]) on 07/03/2023 10:00:20 AM     Risk Assessment/Calculations:             Physical Exam:   VS:  BP (!) 150/78 (BP Location: Left Arm, Patient Position:  Sitting, Cuff Size: Large)   Pulse (!) 54   Ht 5\' 11"  (1.803 m)   Wt 235 lb (106.6 kg)   SpO2 96%   BMI 32.78 kg/m    Wt Readings from Last 3 Encounters:  07/03/23 235 lb (106.6 kg)  07/25/22 250 lb (113.4 kg)  05/04/22 242 lb 3.2 oz (109.9 kg)     GEN: Well nourished, well developed in no acute distress NECK: No JVD; No carotid bruits CARDIAC: Regular rate and rhythm, no murmurs, rubs, gallops RESPIRATORY:  Clear to auscultation without rales, wheezing or rhonchi  ABDOMEN: Soft, non-tender, non-distended EXTREMITIES:  No edema; No deformity   ASSESSMENT AND PLAN:    Hypertension: Blood pressure is elevated today.  It was significantly elevated over the weekend.  He is compliant with his lisinopril and carvedilol.  I do think he would benefit from a third agent.  Theodore Ballard start chlorthalidone 25 mg daily. CAD: mild and nonobstructive. No chest pain, plan per primary cardiology Snoring: Patient has difficult to control blood pressure and daytime fatigue.  He also has significant snoring and sleeps in a different room from his wife.  Theodore Ballard plan for sleep study.  With primary cardiology  Follow up with primary cardiology in 4 weeks  Signed, Lessly Stigler Jorja Loa, MD

## 2023-07-03 ENCOUNTER — Telehealth: Payer: Self-pay | Admitting: Radiology

## 2023-07-03 ENCOUNTER — Ambulatory Visit: Attending: Cardiology | Admitting: Cardiology

## 2023-07-03 ENCOUNTER — Encounter: Payer: Self-pay | Admitting: Cardiology

## 2023-07-03 VITALS — BP 150/78 | HR 54 | Ht 71.0 in | Wt 235.0 lb

## 2023-07-03 DIAGNOSIS — R002 Palpitations: Secondary | ICD-10-CM | POA: Diagnosis not present

## 2023-07-03 DIAGNOSIS — R0683 Snoring: Secondary | ICD-10-CM

## 2023-07-03 DIAGNOSIS — I2510 Atherosclerotic heart disease of native coronary artery without angina pectoris: Secondary | ICD-10-CM

## 2023-07-03 DIAGNOSIS — I10 Essential (primary) hypertension: Secondary | ICD-10-CM

## 2023-07-03 MED ORDER — CHLORTHALIDONE 25 MG PO TABS: 25.0000 mg | ORAL_TABLET | Freq: Every day | ORAL | 3 refills | Status: AC

## 2023-07-03 NOTE — Telephone Encounter (Signed)
 1. Patient agreement reviewed and signed on 07/03/23. 2. WatchPAT issued to patient on 07/03/23, [name], CCT Patient aware to not open the WatchPAT box until contacted with the activation PIN. 3. Patient profile initialized in CloudPAT on 07/03/23 4. Device serial number: [id number]

## 2023-07-03 NOTE — Patient Instructions (Signed)
 Medication Instructions:  Your physician has recommended you make the following change in your medication:   ** Begin Chlorthalidone 25mg  - 1 tablet by mouth daily  *If you need a refill on your cardiac medications before your next appointment, please call your pharmacy*  Lab Work: None ordered.  If you have labs (blood work) drawn today and your tests are completely normal, you will receive your results only by: MyChart Message (if you have MyChart) OR A paper copy in the mail If you have any lab test that is abnormal or we need to change your treatment, we will call you to review the results.  Testing/Procedures: Your physician has recommended that you have a sleep study. This test records several body functions during sleep, including: brain activity, eye movement, oxygen and carbon dioxide blood levels, heart rate and rhythm, breathing rate and rhythm, the flow of air through your mouth and nose, snoring, body muscle movements, and chest and belly movement.   Follow-Up: At [facility], you and your health needs are our priority.  As part of our continuing mission to provide you with exceptional heart care, our providers are all part of one team.  This team includes your primary Cardiologist (physician) and Advanced Practice Providers or APPs (Physician Assistants and Nurse Practitioners) who all work together to provide you with the care you need, when you need it.  Your next appointment:   Follow up as scheduled      1st Floor: - Lobby - Registration  - Pharmacy  - Lab - Cafe  2nd Floor: - PV Lab - Diagnostic Testing (echo, CT, nuclear med)  3rd Floor: - Vacant  4th Floor: - TCTS (cardiothoracic surgery) - AFib Clinic - Structural Heart Clinic - Vascular Surgery  - Vascular Ultrasound  5th Floor: - [facility] Cardiology (general and EP) - Clinical Pharmacy for coumadin, hypertension, lipid, weight-loss medications, and med management  appointments    Valet parking services will be available as well.

## 2023-07-11 ENCOUNTER — Telehealth: Payer: Self-pay | Admitting: Cardiology

## 2023-07-11 NOTE — Telephone Encounter (Signed)
 Spoke with patient, not approved yet but would forward this message to our sleep team to check also. Patient aware this can take awhile since we have to wait on insurance approval prior to use. Patient thankful for the returned call, no further needs

## 2023-07-11 NOTE — Telephone Encounter (Signed)
 Patient calling in to see if he has been approve for the WatchPat. Please advise

## 2023-07-12 NOTE — Telephone Encounter (Signed)
 Ordering provider: Camnitz Associated diagnoses: Snoring WatchPAT PA obtained on 07/12/2023 by Maceo Sax, LPN. Authorization: Yes; tracking ID  [id number] Patient notified of PIN (1234) on 07/12/2023 via Notification Method: phone.

## 2023-07-16 NOTE — Telephone Encounter (Signed)
**Note De-Identified Oma Alpert Obfuscation** Please see phone note from 4/15 [facility]) for updates.  [place]amar-HST PA not required per [employer] and the pt is aware.

## 2023-07-16 NOTE — Telephone Encounter (Signed)
**Note De-Identified Steed Kanaan Obfuscation** Ordering provider: Dr Lawana Pray Associated diagnoses: Snoring-R06.83 and Palpitations-R00.2 WatchPAT PA obtained on 07/16/2023 by Dewey Viens, Isabella Mao, LPN. Authorization: Per the [employer] provider portal, PA is not required for CPT Code: 28315 (Itamar-HST). Patient notified of PIN (1234) on 07/16/2023 Laquia Rosano Notification Method: phone.  Phone note routed to covering staff for follow-up.

## 2023-07-18 DIAGNOSIS — X32XXXD Exposure to sunlight, subsequent encounter: Secondary | ICD-10-CM | POA: Diagnosis not present

## 2023-07-18 DIAGNOSIS — L570 Actinic keratosis: Secondary | ICD-10-CM | POA: Diagnosis not present

## 2023-07-20 ENCOUNTER — Encounter (INDEPENDENT_AMBULATORY_CARE_PROVIDER_SITE_OTHER): Payer: Self-pay | Admitting: Cardiology

## 2023-07-20 DIAGNOSIS — G4733 Obstructive sleep apnea (adult) (pediatric): Secondary | ICD-10-CM | POA: Diagnosis not present

## 2023-07-20 DIAGNOSIS — I480 Paroxysmal atrial fibrillation: Secondary | ICD-10-CM

## 2023-07-24 ENCOUNTER — Ambulatory Visit: Attending: Cardiology

## 2023-07-24 DIAGNOSIS — R0683 Snoring: Secondary | ICD-10-CM

## 2023-07-24 NOTE — Procedures (Signed)
   SLEEP STUDY REPORT Patient Information Study Date: 07/20/2023 Patient Name: Theodore Ballard Patient ID: [id number] Birth Date: 06-May-1955 Age: 68 Gender: BMI: 33.0 (W=236 lb, H=5' 11'') Stopbang: 6 Referring Physician: Agatha Horsfall, MD  TEST DESCRIPTION: Home sleep apnea testing was completed using the WatchPat, a Type 1 device, utilizing peripheral arterial tonometry (PAT), chest movement, actigraphy, pulse oximetry, pulse rate, body position and snore. AHI was calculated with apnea and hypopnea using valid sleep time as the denominator. RDI includes apneas, hypopneas, and RERAs. The data acquired and the scoring of sleep and all associated events were performed in accordance with the recommended standards and specifications as outlined in the AASM Manual for the Scoring of Sleep and Associated Events 2.2.0 (2015).  FINDINGS:  1. Mild Obstructive Sleep Apnea with AHI 13.7/hr.  2. No Central Sleep Apnea with pAHIc 0.5/hr.  3. Oxygen desaturations as low as 83%.  4. Severe snoring was present. O2 sats were < 88% for 0.9 min.  5. Total sleep time was 7 hrs and 40 min.  6. 10.2% of total sleep time was spent in REM sleep.  7. Normal sleep onset latency at 17 min.  8. Prolonged REM sleep onset latency at 169 min.  9. Total awakenings were 13. 10. Arrhythmia detection: None  DIAGNOSIS: Mild Obstructive Sleep Apnea (G47.33)  RECOMMENDATIONS: 1. Clinical correlation of these findings is necessary. The decision to treat obstructive sleep apnea (OSA) is usually based on the presence of apnea symptoms or the presence of associated medical conditions such as Hypertension, Congestive Heart Failure, Atrial Fibrillation or Obesity. The most common symptoms of OSA are snoring, gasping for breath while sleeping, daytime sleepiness and fatigue. 2. Initiating apnea therapy is recommended given the presence of symptoms and/or associated conditions. Recommend proceeding with one of the  following:  a. Auto-CPAP therapy with a pressure range of 5-20cm H2O.  b. An oral appliance (OA) that can be obtained from certain dentists with expertise in sleep medicine. These are primarily of use in non-obese patients with mild and moderate disease.  c. An ENT consultation which may be useful to look for specific causes of obstruction and possible treatment options.  d. If patient is intolerant to PAP therapy, consider referral to ENT for evaluation for hypoglossal nerve stimulator. 3. Close follow-up is necessary to ensure success with CPAP or oral appliance therapy for maximum benefit . 4. A follow-up oximetry study on CPAP is recommended to assess the adequacy of therapy and determine the need for supplemental oxygen or the potential need for Bi-level therapy. An arterial blood gas to determine the adequacy of baseline ventilation and oxygenation should also be considered. 5. Healthy sleep recommendations include: adequate nightly sleep (normal 7-9 hrs/night), avoidance of caffeine after noon and alcohol near bedtime, and maintaining a sleep environment that is cool, dark and quiet. 6. Weight loss for overweight patients is recommended. Even modest amounts of weight loss can significantly improve the severity of sleep apnea. 7. Snoring recommendations include: weight loss where appropriate, side sleeping, and avoidance of alcohol before bed. 8. Operation of motor vehicle should be avoided when sleepy.  Signature: Gaylyn Keas, MD; [facility]; Diplomat, American Board of Sleep Medicine Electronically Signed: 07/24/2023 5:49:44 PM

## 2023-07-26 ENCOUNTER — Telehealth: Payer: Self-pay

## 2023-07-26 NOTE — Telephone Encounter (Signed)
 Notified patient of sleep study results and recommendations. All questions (if any) were answered and patient verbalized understanding. CPAP order sent to [facility] 5/8

## 2023-07-26 NOTE — Telephone Encounter (Signed)
-----   Message from Gaylyn Keas sent at 07/24/2023  5:50 PM EDT ----- Please let patient know that they have sleep apnea and recommend treating with CPAP.  Please order an auto CPAP from 4-15cm H2O with heated humidity and mask of choice.  Order overnight pulse ox on CPAP.  Followup with me in 6 weeks.

## 2023-08-10 DIAGNOSIS — H2513 Age-related nuclear cataract, bilateral: Secondary | ICD-10-CM | POA: Diagnosis not present

## 2023-08-10 DIAGNOSIS — H52223 Regular astigmatism, bilateral: Secondary | ICD-10-CM | POA: Diagnosis not present

## 2023-08-10 DIAGNOSIS — Z7984 Long term (current) use of oral hypoglycemic drugs: Secondary | ICD-10-CM | POA: Diagnosis not present

## 2023-08-10 DIAGNOSIS — H353111 Nonexudative age-related macular degeneration, right eye, early dry stage: Secondary | ICD-10-CM | POA: Diagnosis not present

## 2023-08-10 DIAGNOSIS — H43813 Vitreous degeneration, bilateral: Secondary | ICD-10-CM | POA: Diagnosis not present

## 2023-08-10 DIAGNOSIS — E119 Type 2 diabetes mellitus without complications: Secondary | ICD-10-CM | POA: Diagnosis not present

## 2023-08-10 DIAGNOSIS — H5203 Hypermetropia, bilateral: Secondary | ICD-10-CM | POA: Diagnosis not present

## 2023-08-14 NOTE — Telephone Encounter (Signed)
 Patient is calling requesting an update on picking up his mask. Please advise.

## 2023-08-17 NOTE — Telephone Encounter (Signed)
 Called patient and he was inquiring about his cpap machine. Patient understands [facility] will contact him when they have the approval.

## 2023-08-27 NOTE — Telephone Encounter (Signed)
 Pt calling asking if there is an update on approval of CPAP.

## 2023-09-10 NOTE — Telephone Encounter (Signed)
 Left VM with number to [facility], notified patient that order has been received and processed and someone from [facility] will reach ou to patient regarding order.

## 2023-09-17 ENCOUNTER — Other Ambulatory Visit: Payer: Self-pay | Admitting: Internal Medicine

## 2023-09-17 DIAGNOSIS — E785 Hyperlipidemia, unspecified: Secondary | ICD-10-CM

## 2023-09-24 DIAGNOSIS — G4733 Obstructive sleep apnea (adult) (pediatric): Secondary | ICD-10-CM | POA: Diagnosis not present

## 2023-10-01 ENCOUNTER — Ambulatory Visit: Attending: Internal Medicine | Admitting: Internal Medicine

## 2023-10-01 ENCOUNTER — Encounter: Payer: Self-pay | Admitting: Internal Medicine

## 2023-10-01 VITALS — BP 123/85 | HR 63 | Ht 71.0 in | Wt 247.6 lb

## 2023-10-01 DIAGNOSIS — R072 Precordial pain: Secondary | ICD-10-CM | POA: Diagnosis not present

## 2023-10-01 DIAGNOSIS — I2510 Atherosclerotic heart disease of native coronary artery without angina pectoris: Secondary | ICD-10-CM | POA: Diagnosis not present

## 2023-10-01 DIAGNOSIS — E781 Pure hyperglyceridemia: Secondary | ICD-10-CM | POA: Diagnosis not present

## 2023-10-01 NOTE — Progress Notes (Signed)
 Cardiology Office Note:    Date:  10/01/2023   ID:  Roddie, Riegler October 19, 1955, MRN [id number]  PCP:  Charlott Dorn LABOR, MD   [facility] Providers Cardiologist:  Stanly LABOR Leavens, MD     Referring MD: Charlott Dorn LABOR, *   CC: CT f/u  History of Present Illness:    Izeah Vossler is a 68 y.o. adult with a hx of CAC, and aortic atherosclerosis.  Seeing lipid clinic for prevention disease related to statin myalgia. Has issues with gout flare on fenofibrate ; did not feel comfortable trying vascepa .  Myalgias with atorvastatin and crestor . 2024: had PACs asymptomatic SVT and mild non obstructive CAD  Cote Mayabb is a 68 year old male with coronary artery calcifications and aortic atherosclerosis who presents with chest discomfort.  He has been experiencing minor chest discomfort described as a 'little tingle' or 'injection' sensation over the past three to four weeks. The discomfort is not associated with soreness and occurs even when he is not working. He suspects it might be related to physical activities such as lifting, but is uncertain.  He has a history of coronary artery calcifications, aortic atherosclerosis, premature atrial contractions, supraventricular tachycardia, and mild nonobstructive coronary disease. He is currently on Coreg, and his LDL levels have improved on his current therapy.  He mentions a past episode where his blood pressure spiked to 280, prompting his mother to consider calling 911. He was started on a new medication, Ploethalidone, which initially caused his blood pressure to drop significantly, leading him to adjust the dose to a quarter of the pill. He has been on this adjusted dose for about two weeks.  He is a Visual merchandiser and remains very active, engaging in activities such as pulling up and getting in and out of tractors, and cutting and lifting wood. Despite his active lifestyle, he denies significant foot pain,  breathing difficulties, or episodes of passing out. He notes feeling stressed during work.    Past Medical History:  Diagnosis Date   Anxiety    Arthritis    Environmental allergies    GERD (gastroesophageal reflux disease)    Gout    Hyperlipidemia    Hypertension     Past Surgical History:  Procedure Laterality Date   COLONOSCOPY     HEEL SPUR SURGERY Left    REVERSE SHOULDER ARTHROPLASTY Left 02/02/2022   Procedure: REVERSE SHOULDER ARTHROPLASTY;  Surgeon: Cristy Bonner DASEN, MD;  Location:  [facility];  Service: Orthopedics;  Laterality: Left;    Current Medications: Current Meds  Medication Sig   allopurinol  (ZYLOPRIM ) 300 MG tablet Take 1 tablet (300 mg total) by mouth daily.   ASPIRIN  81 PO daily at 6 (six) AM.   augmented betamethasone dipropionate (DIPROLENE-AF) 0.05 % cream Apply topically as needed (rash).   carvedilol (COREG) 25 MG tablet Take 25 mg by mouth 2 (two) times daily with a meal.   chlorthalidone  (HYGROTON ) 25 MG tablet Take 1 tablet (25 mg total) by mouth daily. (Patient taking differently: Take 25 mg by mouth as needed (if bp is over 150).)   Cholecalciferol (VITAMIN D3 PO) daily.   Cyanocobalamin  (VITAMIN B 12 PO) daily at 6 (six) AM.   diclofenac Sodium (VOLTAREN) 1 % GEL Apply topically as needed (pain).   diphenoxylate -atropine  (LOMOTIL ) 2.5-0.025 MG tablet Take 1-2 tablets by mouth 4 (four) times daily as needed for diarrhea or loose stools.   gabapentin  (NEURONTIN ) 100 MG capsule as  needed (neuropathy).   glipiZIDE  (GLUCOTROL  XL) 2.5 MG 24 hr tablet as needed (flareup).   lisinopril  (ZESTRIL ) 20 MG tablet Take 20 mg by mouth 2 (two) times daily.   loratadine  (CLARITIN ) 10 MG tablet as needed for allergies.   Multiple Vitamin (MULTI VITAMIN MENS PO) Take by mouth daily at 6 (six) AM.   nitroGLYCERIN  (NITROSTAT ) 0.4 MG SL tablet Place 1 tablet (0.4 mg total) under the tongue every 5 (five) minutes as needed for chest pain.   olopatadine   (PATANOL) 0.1 % ophthalmic solution Place 1 drop into both eyes 2 (two) times daily.   pantoprazole  (PROTONIX ) 40 MG tablet TAKE 1 TABLET BY MOUTH DAILY AS NEEDED   pravastatin  (PRAVACHOL ) 80 MG tablet TAKE 1 TABLET BY MOUTH EVERY DAY IN THE EVENING   predniSONE (DELTASONE) 10 MG tablet Take 10 mg by mouth as needed (gout).   SYMBICORT 80-4.5 MCG/ACT inhaler Inhale into the lungs daily at 6 (six) AM.     Allergies:   Niacin and related, Bactrim  [sulfamethoxazole -trimethoprim ], Atorvastatin, Crestor  [rosuvastatin ], Fenofibrate , Fish oil, Gemfibrozil , Icosapent  ethyl (epa ethyl ester) (fish), Lovaza  [omega-3-acid  ethyl esters (fish)], Metformin, Ozempic (0.25 or 0.5 mg-dose) [semaglutide(0.25 or 0.5mg -dos)], and Penicillins   Social History   Socioeconomic History   Marital status: Married    Spouse name: Not on file   Number of children: Not on file   Years of education: Not on file   Highest education level: Not on file  Occupational History   Not on file  Tobacco Use   Smoking status: Never   Smokeless tobacco: Never  Substance and Sexual Activity   Alcohol use: Yes    Comment: occ beer   Drug use: No   Sexual activity: Yes  Other Topics Concern   Not on file  Social History Narrative   Not on file   Social Drivers of Health   Financial Resource Strain: Not on file  Food Insecurity: Not on file  Transportation Needs: Not on file  Physical Activity: Not on file  Stress: Not on file  Social Connections: Not on file    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  ROS:   Please see the history of present illness.     EKGs/Labs/Other Studies Reviewed:    The following studies were reviewed today: Cardiac Studies & Procedures    ______________________________________________________________________________________________        SHERRILEE  LONG TERM MONITOR (3-14 DAYS) 05/23/2022  Narrative   Patient had a minimum heart rate of 42 bpm, maximum heart rate of 162 bpm, and average heart rate of 66 bpm.   Predominant underlying rhythm was sinus rhythm .   Paroxysmal SVT  lasting 6 beats at longest with a max rate of 162 bpm at fastest.   Isolated PACs were rare (<1.0%).   Isolated PVCs were rare (<1.0%).   Triggered and diary events associated with sinus rhythm and PACs.  No malignant arrhythmias.   CT SCANS  CT CORONARY MORPH W/CTA COR W/SCORE 05/26/2022  Addendum 05/28/2022 11:46 PM ADDENDUM REPORT: 05/28/2022 23:44  EXAM: OVER-READ INTERPRETATION  CT CHEST  The following report is an over-read performed by radiologist Dr. Norman Gins [facility] on 05/28/2022. This over-read does not include interpretation of cardiac or coronary anatomy or pathology. The coronary CTA interpretation by the cardiologist is attached.  COMPARISON:  CT chest  01/16/2021  FINDINGS: No central pulmonary embolus. The imaged portion of the mediastinum is unremarkable. No visualized focal consolidation, pleural effusion, or pneumothorax. No acute abnormality in the visualized upper abdomen. No acute osseous abnormality.  IMPRESSION: No acute abnormality.   Electronically Signed By: Norman Gatlin M.D. On: 05/28/2022 23:44  Narrative HISTORY: Chest pain, nonspecific  EXAM: Cardiac/Coronary CT  TECHNIQUE: The patient was scanned on a [employer].  PROTOCOL: A 120 kV prospective scan was triggered in the descending thoracic aorta at 111 HU's. Axial non-contrast 3 mm slices were carried out through the heart. The data set was analyzed on a dedicated work station and scored using the Agatston method. Gantry rotation speed was 250 msecs and collimation was 0.6 mm. Heart rate was  optimized medically and sl NTG was given. The 3D data set was reconstructed in 5% intervals of the 35-75 % of the R-R cycle. Systolic and diastolic phases were analyzed on a dedicated work station using MPR, MIP and VRT modes. The patient received 100mL OMNIPAQUE  IOHEXOL  350 MG/ML SOLN of contrast.  FINDINGS: Coronary calcium  score: The patient's coronary artery calcium  score is 638, which places the patient in the 81 percentile.  Coronary arteries: Normal coronary origins.  Right dominance.  Right Coronary Artery: Normal caliber vessel, gives rise to PDA. Proximal RCA with focal calcified plaque with 1-24% stenosis. Mid RCA with focal calcified plaque with 1-24% stenosis. PDA with focal calcified plaque and 1-24% stenosis.  Left Main Coronary Artery: Normal caliber vessel. No significant plaque or stenosis.  Left Anterior Descending Coronary Artery: Normal caliber vessel. Scattered predominantly calcified plaque in proximal vessel with maximum 25-49% stenosis. Gives rise to three small caliber diagonal branches.  Left Circumflex Artery: Normal caliber vessel. No significant plaque or stenosis. Gives rise to small first and medium second OM branches.  Aorta: Normal size, 36 mm at the mid ascending aorta (level of the PA bifurcation) measured double oblique. No aortic atherosclerosis. No dissection seen in visualized portions of the aorta.  Aortic Valve: No calcifications. Trileaflet.  Other findings:  Normal pulmonary vein drainage into the left atrium.  Normal left atrial appendage without a thrombus.  Normal size of the pulmonary artery.  Normal appearance of the pericardium.  IMPRESSION: 1. Mild nonobstructive CAD, CADRADS = 2. Proximal LAD with 25-49% stenosis and scattered calcified plaque.  2. Coronary calcium  score of 638. This was 81st percentile for age and sex matched control.  3. Normal coronary origin with right  dominance.  INTERPRETATION:  CAD-RADS 2: Mild non-obstructive CAD (25-49%). Consider non-atherosclerotic causes of chest pain. Consider preventive therapy and risk factor modification.  Electronically Signed: By: Shelda Bruckner M.D. On: 05/26/2022 14:28     ______________________________________________________________________________________________       Recent Labs: No results found for requested labs within last 365 days.  Recent Lipid Panel    Component Value Date/Time   CHOL 148 08/08/2022 0952   TRIG 633 (HH) 08/08/2022 0952   HDL 41 08/08/2022 0952   CHOLHDL 3.6 08/08/2022 0952   CHOLHDL 4.6 06/25/2020 0930   LDLCALC 24 08/08/2022 0952   LDLCALC  06/25/2020 0930     Comment:     . LDL cholesterol not calculated. Triglyceride levels greater than 400 mg/dL invalidate calculated LDL results. . Reference range: <100 . Desirable range <100 mg/dL for primary prevention;   <70 mg/dL for patients with CHD or diabetic patients  with > or = 2 CHD risk factors. SABRA LDL-C is now calculated using the  Martin-Hopkins  calculation, which is a validated novel method providing  better accuracy than the Friedewald equation in the  estimation of LDL-C.  Gladis APPLETHWAITE et al. SANDREA. 7986;689(80): 2061-2068  (http://education.[employer].com/faq/FAQ164)    STOP-Bang Score:  6       Physical Exam:    VS:  BP 123/85 (BP Location: Right Arm)   Pulse 63   Ht 5' 11 (1.803 m)   Wt 247 lb 9.6 oz (112.3 kg)   SpO2 97%   BMI 34.53 kg/m     Wt Readings from Last 3 Encounters:  10/01/23 247 lb 9.6 oz (112.3 kg)  07/03/23 235 lb (106.6 kg)  07/25/22 250 lb (113.4 kg)    GEN:  Well nourished, well developed in no acute distress HEENT: Frank's sIgn NECK: No JVD CARDIAC: RRR, no murmurs, rubs, gallops RESPIRATORY:  Clear to auscultation without rales, wheezing or rhonchi  ABDOMEN: Soft, non-tender, non-distended MUSCULOSKELETAL:  No edema; No deformity  SKIN: Warm  and dry NEUROLOGIC:  Alert and oriented x 3 PSYCHIATRIC:  Normal affect   ASSESSMENT:    1. Precordial pain   2. Coronary artery disease involving native coronary artery of native heart without angina pectoris   3. Hypertriglyceridemia      PLAN:    Coronary artery disease Intermittent chest discomfort described as a minor tingle, possibly related to physical activity. Differential includes coronary microvascular dysfunction or musculoskeletal pain from physical labor. Current LDL levels and blood pressure are well-controlled. - Perform EKG to assess for any changes since last evaluation - Consider stress test if symptoms worsen or if EKG indicates changes (PET MPI consented but not ordered) - Continue current medication regimen including nitroglycerin  as needed  HLD Aortic atherosclerosis Well-controlled with current management as blood pressure is well-managed. - repeat labs pending for discussion of TG therapy  Hypertension Blood pressure is well-controlled with medication. Recent episode of elevated blood pressure was managed with medication adjustment. - Continue current medication regimen - Perform fasting cholesterol check at next convenient visit  Paroxysmal supraventricular tachycardia (PSVT) PACs No recent episodes reported. Previous management with medication appears effective. - Continue current medication regimen - Monitor for any recurrence of symptoms  Statin myalgia No current complaints of myalgia. Previous management with statin therapy adjustment appears effective.       Stanly Leavens, MD FASE [facility] Cardiologist [facility]  [address] [place], [place] [address] [phone]  12:36 PM

## 2023-10-01 NOTE — Patient Instructions (Signed)
 Medication Instructions:  Your physician recommends that you continue on your current medications as directed. Please refer to the Current Medication list given to you today.  *If you need a refill on your cardiac medications before your next appointment, please call your pharmacy*  Lab Work: Fasting lipid panel at any [employer]  If you have labs (blood work) drawn today and your tests are completely normal, you will receive your results only by: MyChart Message (if you have MyChart) OR A paper copy in the mail If you have any lab test that is abnormal or we need to change your treatment, we will call you to review the results.  Testing/Procedures: NONE  Follow-Up: At [facility], you and your health needs are our priority.  As part of our continuing mission to provide you with exceptional heart care, our providers are all part of one team.  This team includes your primary Cardiologist (physician) and Advanced Practice Providers or APPs (Physician Assistants and Nurse Practitioners) who all work together to provide you with the care you need, when you need it.  Your next appointment:   6 month(s)  Provider:   Stanly DELENA Leavens, MD or One of our Advanced Practice Providers (APPs): Morse Clause, PA-C  Lamarr Satterfield, NP Miriam Shams, NP  Olivia Pavy, PA-C Josefa Beauvais, NP  Leontine Salen, PA-C Orren Fabry, PA-C  [place], PA-C Ernest Dick, NP  Damien Braver, NP Jon Hails, PA-C  Waddell Donath, PA-C    Dayna Dunn, PA-C  Glendia Ferrier, PA-C Lum Louis, NP Katlyn West, NP Callie Goodrich, PA-C  Evan Williams, PA-C Sheng Haley, PA-C  Xika Zhao, NP Kathleen Johnson, PA-C

## 2023-10-02 DIAGNOSIS — Z79899 Other long term (current) drug therapy: Secondary | ICD-10-CM | POA: Diagnosis not present

## 2023-10-02 DIAGNOSIS — I10 Essential (primary) hypertension: Secondary | ICD-10-CM | POA: Diagnosis not present

## 2023-10-02 DIAGNOSIS — M109 Gout, unspecified: Secondary | ICD-10-CM | POA: Diagnosis not present

## 2023-10-02 DIAGNOSIS — D649 Anemia, unspecified: Secondary | ICD-10-CM | POA: Diagnosis not present

## 2023-10-02 DIAGNOSIS — E1169 Type 2 diabetes mellitus with other specified complication: Secondary | ICD-10-CM | POA: Diagnosis not present

## 2023-10-02 DIAGNOSIS — J4520 Mild intermittent asthma, uncomplicated: Secondary | ICD-10-CM | POA: Diagnosis not present

## 2023-10-02 DIAGNOSIS — R002 Palpitations: Secondary | ICD-10-CM | POA: Diagnosis not present

## 2023-10-02 DIAGNOSIS — K909 Intestinal malabsorption, unspecified: Secondary | ICD-10-CM | POA: Diagnosis not present

## 2023-10-02 DIAGNOSIS — E781 Pure hyperglyceridemia: Secondary | ICD-10-CM | POA: Diagnosis not present

## 2023-10-02 DIAGNOSIS — Z0000 Encounter for general adult medical examination without abnormal findings: Secondary | ICD-10-CM | POA: Diagnosis not present

## 2023-10-02 DIAGNOSIS — E1142 Type 2 diabetes mellitus with diabetic polyneuropathy: Secondary | ICD-10-CM | POA: Diagnosis not present

## 2023-10-02 DIAGNOSIS — M1711 Unilateral primary osteoarthritis, right knee: Secondary | ICD-10-CM | POA: Diagnosis not present

## 2023-10-09 ENCOUNTER — Telehealth: Payer: Self-pay | Admitting: Cardiology

## 2023-10-09 NOTE — Telephone Encounter (Signed)
 What problem are you experiencing? Patient returning CPAP machine, states he was getting sinus headaches and inflammation in his chest.  Who is your medical equipment company? [employer]  3)    If patient is calling about their sleep study results please route to [facility] Sleep Study Pool.   Please route to the sleep study coordinator.

## 2023-10-10 ENCOUNTER — Ambulatory Visit: Payer: Self-pay

## 2023-10-10 DIAGNOSIS — E781 Pure hyperglyceridemia: Secondary | ICD-10-CM

## 2023-10-10 DIAGNOSIS — I2510 Atherosclerotic heart disease of native coronary artery without angina pectoris: Secondary | ICD-10-CM

## 2023-10-11 NOTE — Telephone Encounter (Signed)
 Call to patient to ask if he has tried nasal saline and/or flonase. No answer, left msg with no identifiers asking recipient to call [place] at our office #.

## 2023-10-26 NOTE — Telephone Encounter (Signed)
 Patient states he was miserable with his cpap. He tried it for 5 days be could not sleep or nothing so he returned his cpap machine to [facility].

## 2023-11-12 ENCOUNTER — Ambulatory Visit: Admitting: Pharmacist

## 2023-12-24 DIAGNOSIS — R972 Elevated prostate specific antigen [PSA]: Secondary | ICD-10-CM | POA: Diagnosis not present

## 2024-02-04 DIAGNOSIS — R3915 Urgency of urination: Secondary | ICD-10-CM | POA: Diagnosis not present

## 2024-02-08 DIAGNOSIS — Z7984 Long term (current) use of oral hypoglycemic drugs: Secondary | ICD-10-CM | POA: Diagnosis not present

## 2024-02-08 DIAGNOSIS — E119 Type 2 diabetes mellitus without complications: Secondary | ICD-10-CM | POA: Diagnosis not present

## 2024-02-08 DIAGNOSIS — H2513 Age-related nuclear cataract, bilateral: Secondary | ICD-10-CM | POA: Diagnosis not present

## 2024-02-08 DIAGNOSIS — H353111 Nonexudative age-related macular degeneration, right eye, early dry stage: Secondary | ICD-10-CM | POA: Diagnosis not present

## 2024-02-08 DIAGNOSIS — H43813 Vitreous degeneration, bilateral: Secondary | ICD-10-CM | POA: Diagnosis not present

## 2024-02-27 ENCOUNTER — Encounter: Payer: Self-pay | Admitting: Internal Medicine
